# Patient Record
Sex: Male | Born: 1935 | Race: White | Hispanic: No | Marital: Married | Smoking: Never smoker
Health system: Southern US, Community
[De-identification: ages and names within clinical notes are randomized; demographics above are authoritative.]

## PROBLEM LIST (undated history)

## (undated) DIAGNOSIS — G4700 Insomnia, unspecified: Secondary | ICD-10-CM

## (undated) DIAGNOSIS — N529 Male erectile dysfunction, unspecified: Secondary | ICD-10-CM

## (undated) DIAGNOSIS — Z9889 Other specified postprocedural states: Secondary | ICD-10-CM

## (undated) DIAGNOSIS — N400 Enlarged prostate without lower urinary tract symptoms: Secondary | ICD-10-CM

## (undated) DIAGNOSIS — M48061 Spinal stenosis, lumbar region without neurogenic claudication: Secondary | ICD-10-CM

## (undated) DIAGNOSIS — M545 Low back pain, unspecified: Secondary | ICD-10-CM

## (undated) DIAGNOSIS — IMO0002 Reserved for concepts with insufficient information to code with codable children: Secondary | ICD-10-CM

## (undated) DIAGNOSIS — K219 Gastro-esophageal reflux disease without esophagitis: Secondary | ICD-10-CM

## (undated) DIAGNOSIS — K649 Unspecified hemorrhoids: Secondary | ICD-10-CM

## (undated) DIAGNOSIS — E785 Hyperlipidemia, unspecified: Secondary | ICD-10-CM

## (undated) DIAGNOSIS — M961 Postlaminectomy syndrome, not elsewhere classified: Secondary | ICD-10-CM

## (undated) DIAGNOSIS — M109 Gout, unspecified: Secondary | ICD-10-CM

## (undated) DIAGNOSIS — C801 Malignant (primary) neoplasm, unspecified: Secondary | ICD-10-CM

## (undated) DIAGNOSIS — Z974 Presence of external hearing-aid: Secondary | ICD-10-CM

## (undated) DIAGNOSIS — G4730 Sleep apnea, unspecified: Secondary | ICD-10-CM

## (undated) DIAGNOSIS — F419 Anxiety disorder, unspecified: Secondary | ICD-10-CM

## (undated) DIAGNOSIS — M25561 Pain in right knee: Secondary | ICD-10-CM

## (undated) HISTORY — DX: Low back pain: M54.5

## (undated) HISTORY — DX: Gout, unspecified: M10.9

## (undated) HISTORY — DX: Low back pain, unspecified: M54.50

## (undated) HISTORY — DX: Gastro-esophageal reflux disease without esophagitis: K21.9

## (undated) HISTORY — DX: Reserved for concepts with insufficient information to code with codable children: IMO0002

## (undated) HISTORY — DX: Insomnia, unspecified: G47.00

## (undated) HISTORY — PX: LUMBAR SPINE SURGERY: SHX701

## (undated) HISTORY — DX: Other specified postprocedural states: Z98.890

## (undated) HISTORY — PX: CATARACT EXTRACTION W/ INTRAOCULAR LENS  IMPLANT, BILATERAL: SHX1307

## (undated) HISTORY — PX: TOTAL HIP ARTHROPLASTY: SHX124

## (undated) HISTORY — DX: Benign prostatic hyperplasia without lower urinary tract symptoms: N40.0

## (undated) HISTORY — PX: COLONOSCOPY: SHX174

## (undated) HISTORY — DX: Pain in right knee: M25.561

## (undated) HISTORY — DX: Unspecified hemorrhoids: K64.9

## (undated) HISTORY — DX: Spinal stenosis, lumbar region without neurogenic claudication: M48.061

## (undated) HISTORY — PX: MOHS SURGERY: SUR867

## (undated) HISTORY — PX: KNEE SURGERY: SHX244

## (undated) HISTORY — PX: WISDOM TOOTH EXTRACTION: SHX21

## (undated) HISTORY — DX: Male erectile dysfunction, unspecified: N52.9

---

## 2000-06-27 ENCOUNTER — Encounter: Payer: Self-pay | Admitting: Family Medicine

## 2000-06-27 ENCOUNTER — Ambulatory Visit (HOSPITAL_COMMUNITY): Admission: RE | Admit: 2000-06-27 | Discharge: 2000-06-27 | Payer: Self-pay | Admitting: Family Medicine

## 2006-10-11 ENCOUNTER — Inpatient Hospital Stay (HOSPITAL_COMMUNITY): Admission: RE | Admit: 2006-10-11 | Discharge: 2006-10-14 | Payer: Self-pay | Admitting: Neurological Surgery

## 2006-11-11 ENCOUNTER — Encounter: Admission: RE | Admit: 2006-11-11 | Discharge: 2006-11-11 | Payer: Self-pay | Admitting: Neurological Surgery

## 2007-01-27 ENCOUNTER — Encounter: Admission: RE | Admit: 2007-01-27 | Discharge: 2007-01-27 | Payer: Self-pay | Admitting: Neurological Surgery

## 2007-04-28 ENCOUNTER — Encounter: Admission: RE | Admit: 2007-04-28 | Discharge: 2007-04-28 | Payer: Self-pay | Admitting: Neurological Surgery

## 2007-09-24 ENCOUNTER — Inpatient Hospital Stay (HOSPITAL_COMMUNITY): Admission: EM | Admit: 2007-09-24 | Discharge: 2007-09-27 | Payer: Self-pay | Admitting: Emergency Medicine

## 2008-02-02 ENCOUNTER — Ambulatory Visit (HOSPITAL_BASED_OUTPATIENT_CLINIC_OR_DEPARTMENT_OTHER): Admission: RE | Admit: 2008-02-02 | Discharge: 2008-02-02 | Payer: Self-pay | Admitting: Orthopedic Surgery

## 2008-06-25 ENCOUNTER — Encounter: Admission: RE | Admit: 2008-06-25 | Discharge: 2008-06-25 | Payer: Self-pay | Admitting: Neurological Surgery

## 2008-07-14 IMAGING — CR DG HAND COMPLETE 3+V*L*
3 series · 3 of 3 positions shown · non-contrast
Comparison: None

CLINICAL DATA: Hip pain.  Fall.  Pain first metacarpal of the left
hand.

LEFT HAND - COMPLETE 3+ VIEW

[x hand pa left]
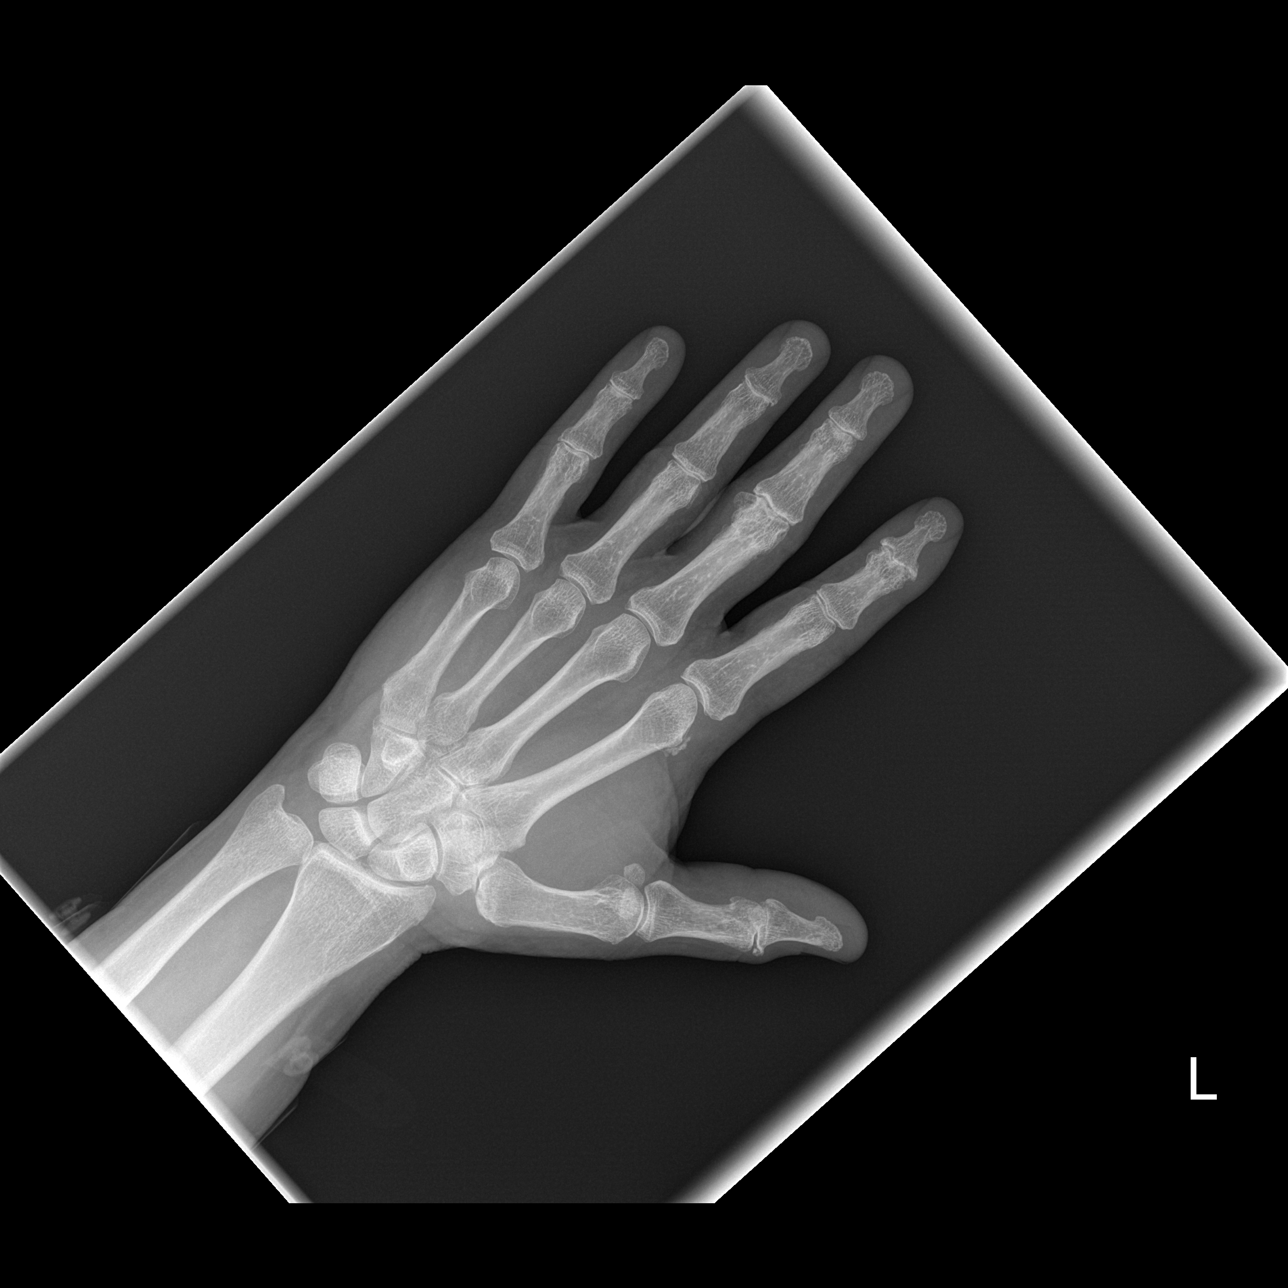

[x hand oblique left]
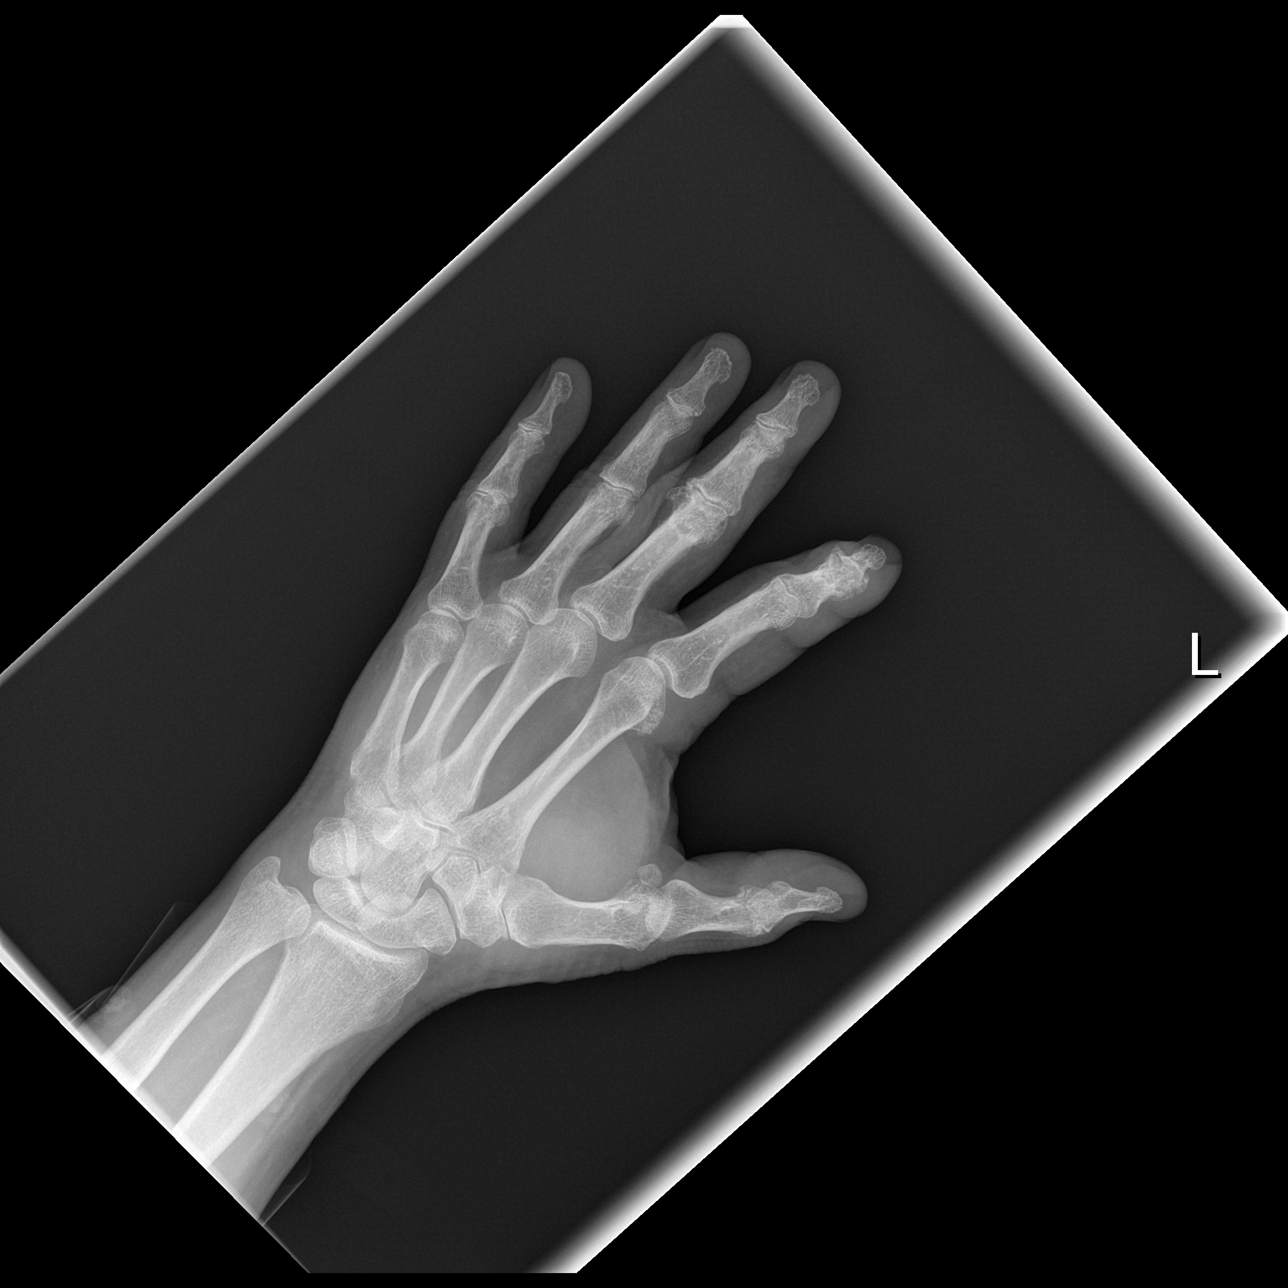

[x hand lat left]
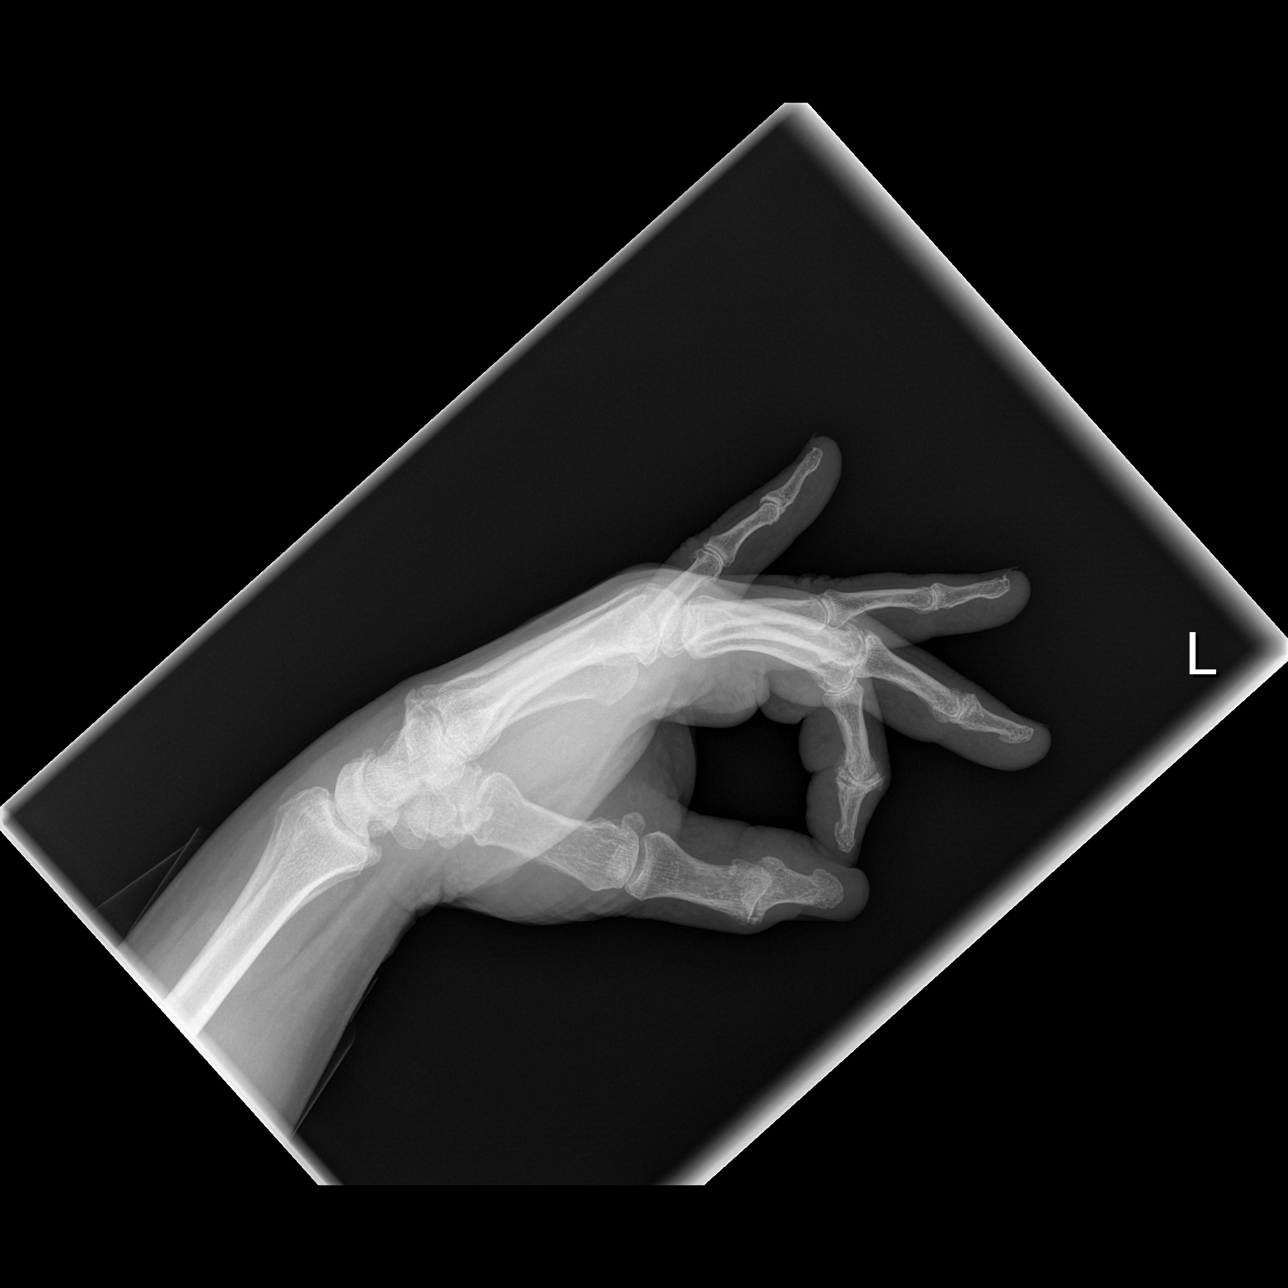

[3 of 3 positions shown; findings below may reference images not displayed]

FINDINGS: Degenerative changes are seen especially the distal
interphalangeal joints.  There is no evidence for acute fracture or
dislocation.  No radiopaque foreign body or soft tissue gas is
identified.
IMPRESSION: No evidence for acute abnormality.

## 2008-07-16 ENCOUNTER — Inpatient Hospital Stay (HOSPITAL_COMMUNITY): Admission: RE | Admit: 2008-07-16 | Discharge: 2008-07-21 | Payer: Self-pay | Admitting: Neurological Surgery

## 2008-08-17 ENCOUNTER — Encounter: Admission: RE | Admit: 2008-08-17 | Discharge: 2008-08-17 | Payer: Self-pay | Admitting: Neurological Surgery

## 2008-08-19 ENCOUNTER — Inpatient Hospital Stay (HOSPITAL_COMMUNITY): Admission: RE | Admit: 2008-08-19 | Discharge: 2008-08-21 | Payer: Self-pay | Admitting: Neurological Surgery

## 2008-09-02 ENCOUNTER — Encounter: Admission: RE | Admit: 2008-09-02 | Discharge: 2008-09-02 | Payer: Self-pay | Admitting: Neurological Surgery

## 2008-09-27 ENCOUNTER — Encounter: Admission: RE | Admit: 2008-09-27 | Discharge: 2008-09-27 | Payer: Self-pay | Admitting: Neurological Surgery

## 2008-11-01 ENCOUNTER — Encounter: Admission: RE | Admit: 2008-11-01 | Discharge: 2008-11-01 | Payer: Self-pay | Admitting: Neurological Surgery

## 2008-12-13 ENCOUNTER — Encounter: Admission: RE | Admit: 2008-12-13 | Discharge: 2008-12-13 | Payer: Self-pay | Admitting: Neurological Surgery

## 2009-01-04 ENCOUNTER — Inpatient Hospital Stay (HOSPITAL_COMMUNITY): Admission: RE | Admit: 2009-01-04 | Discharge: 2009-01-07 | Payer: Self-pay | Admitting: Orthopedic Surgery

## 2009-02-28 ENCOUNTER — Encounter: Admission: RE | Admit: 2009-02-28 | Discharge: 2009-02-28 | Payer: Self-pay | Admitting: Neurological Surgery

## 2009-06-01 ENCOUNTER — Emergency Department (HOSPITAL_COMMUNITY): Admission: EM | Admit: 2009-06-01 | Discharge: 2009-06-02 | Payer: Self-pay | Admitting: Emergency Medicine

## 2009-06-13 ENCOUNTER — Encounter: Admission: RE | Admit: 2009-06-13 | Discharge: 2009-06-13 | Payer: Self-pay | Admitting: Neurological Surgery

## 2009-06-23 IMAGING — CR DG LUMBAR SPINE 2-3V
3 series · 3 of 3 positions shown · non-contrast
Comparison: [DATE]

CLINICAL DATA: Lumbar fusion.  Back pain.

LUMBAR SPINE - 2-3 VIEW

[t l-spine a.p.]
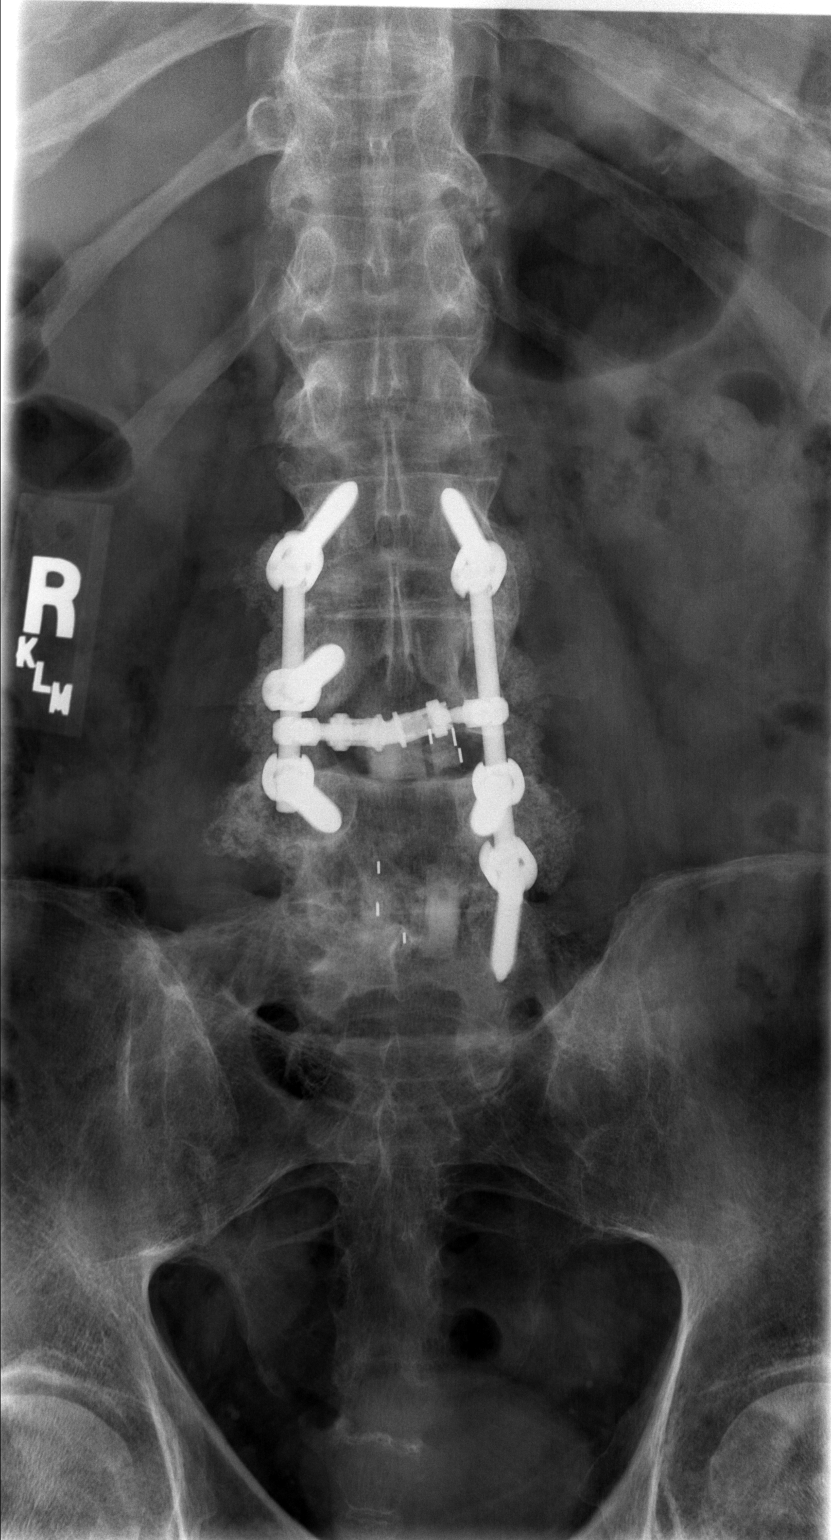

[t l-spine lat]
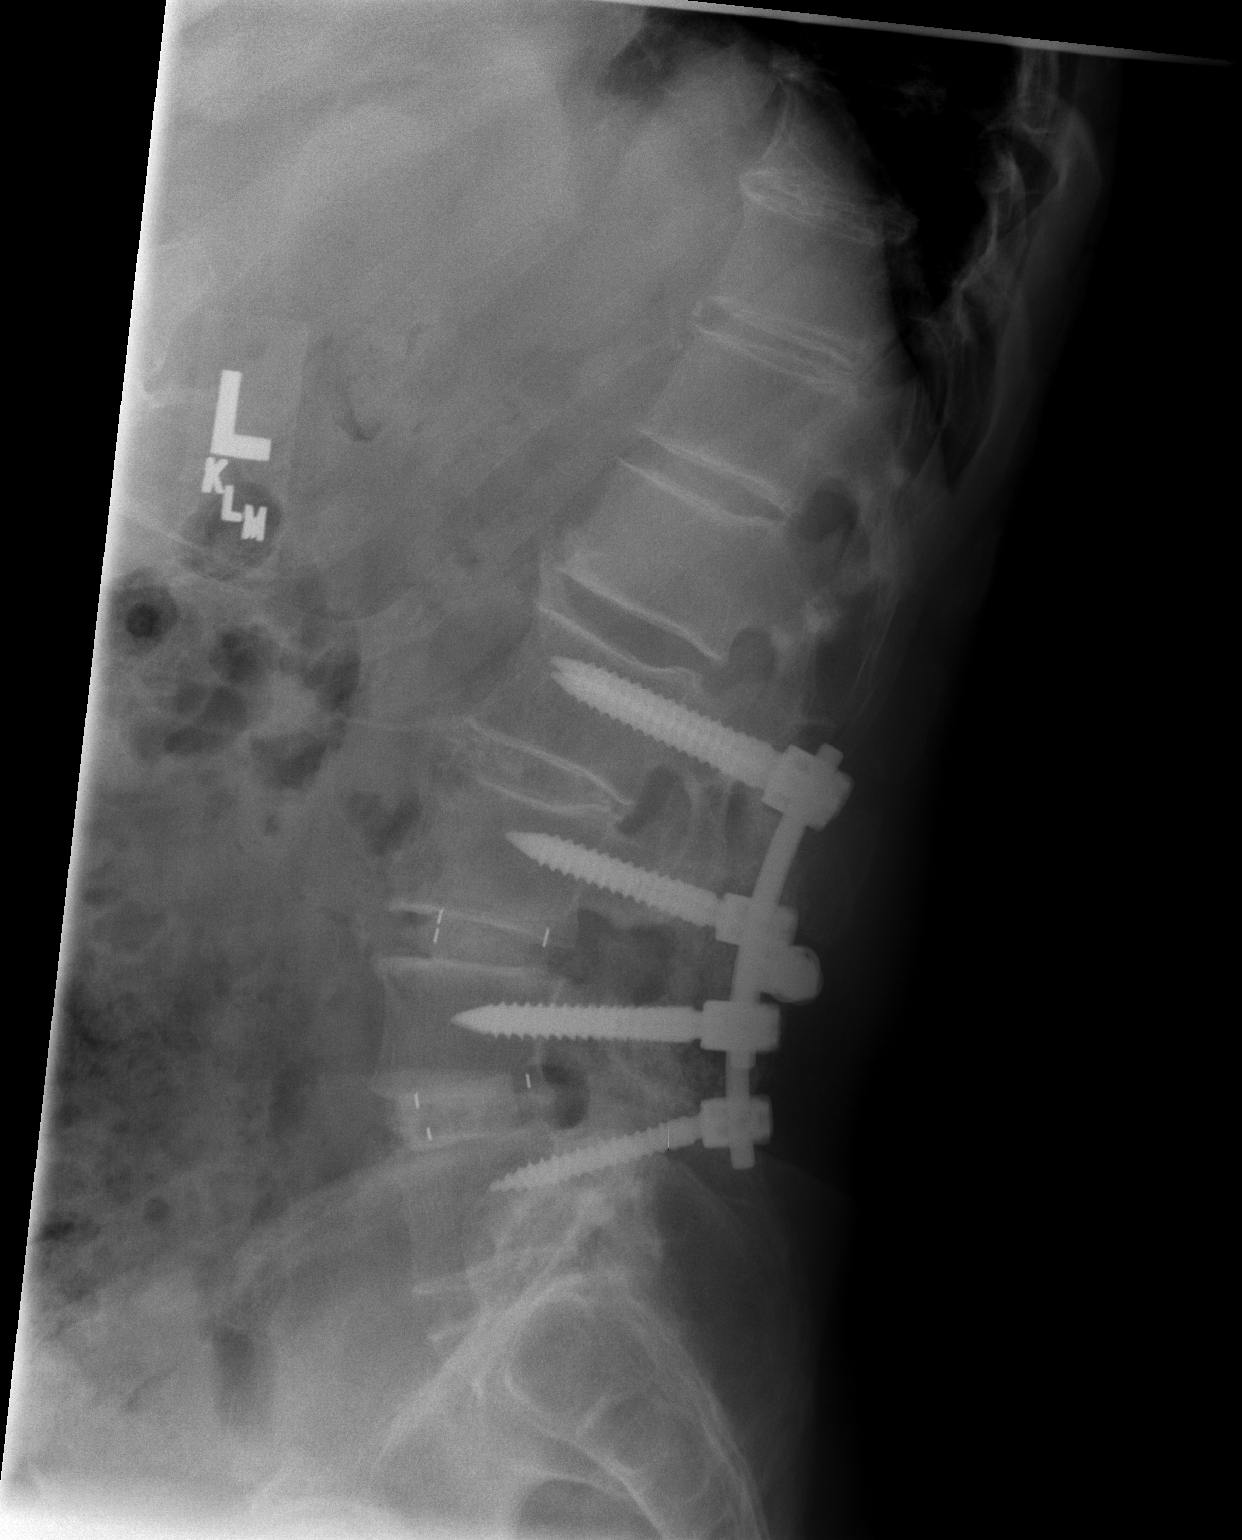

[t l-spine l5-s1 spot]
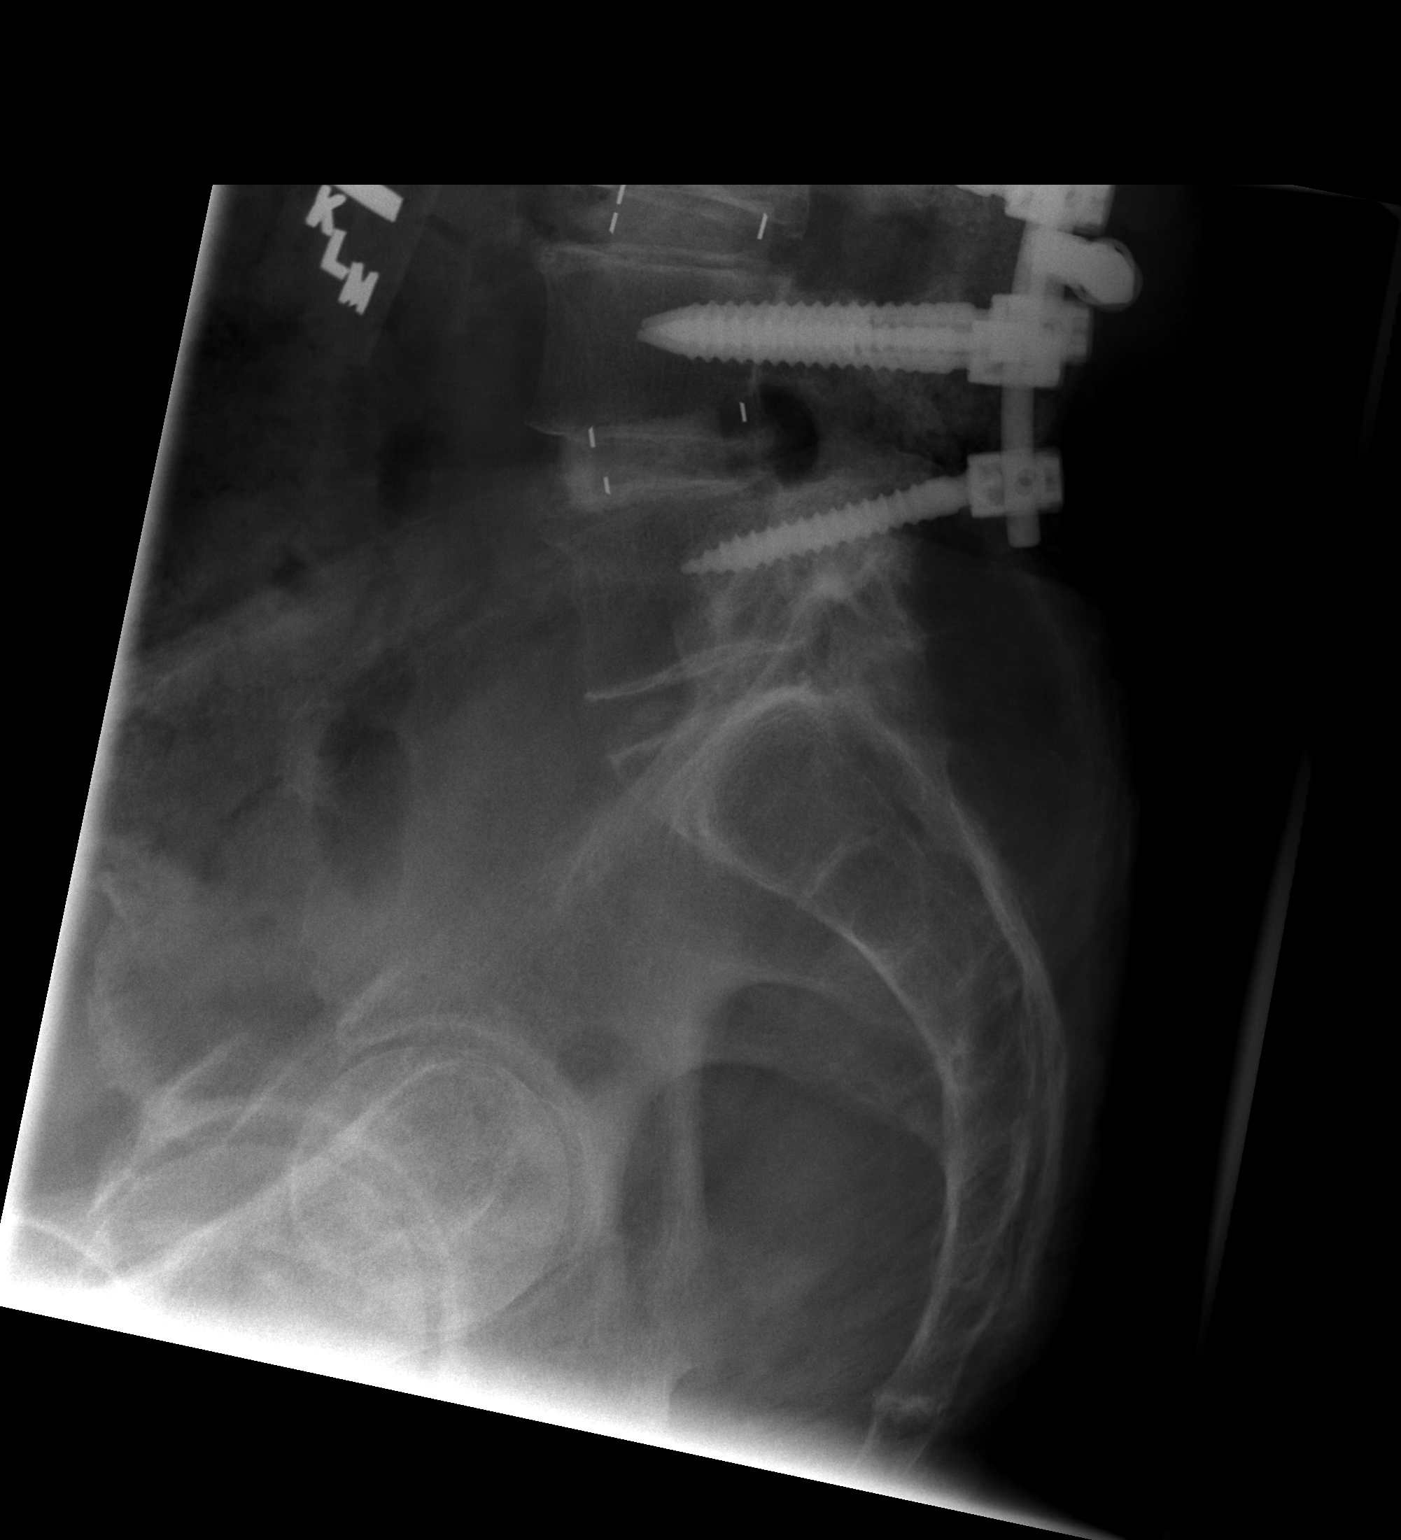

[3 of 3 positions shown; findings below may reference images not displayed]

FINDINGS: There are pedicle screws in place from L2-L5.  These are
bilateral at L2, unilateral on the right at L3, bilaterally at L4 a
unilateral on the left at L5.  Interbody fusion material is in
place at L3-4 and L4-5.  Components appear grossly well positioned.
There is a cross stabilizer.  No radiographically detectable
complication.
IMPRESSION: Decompression and fusion from L2-L5 as described.

## 2010-06-11 LAB — DIFFERENTIAL
Basophils Relative: 0 % (ref 0–1)
Lymphocytes Relative: 32 % (ref 12–46)
Monocytes Absolute: 0.5 10*3/uL (ref 0.1–1.0)
Monocytes Relative: 8 % (ref 3–12)
Neutro Abs: 3.3 10*3/uL (ref 1.7–7.7)
Neutrophils Relative %: 58 % (ref 43–77)

## 2010-06-11 LAB — CBC
Hemoglobin: 14.9 g/dL (ref 13.0–17.0)
MCHC: 34.3 g/dL (ref 30.0–36.0)
RBC: 4.68 MIL/uL (ref 4.22–5.81)
WBC: 5.8 10*3/uL (ref 4.0–10.5)

## 2010-06-11 LAB — HEPATIC FUNCTION PANEL
ALT: 34 U/L (ref 0–53)
AST: 31 U/L (ref 0–37)
Albumin: 4.2 g/dL (ref 3.5–5.2)
Bilirubin, Direct: 0.1 mg/dL (ref 0.0–0.3)
Total Bilirubin: 0.7 mg/dL (ref 0.3–1.2)

## 2010-06-11 LAB — URINALYSIS, ROUTINE W REFLEX MICROSCOPIC
Hgb urine dipstick: NEGATIVE
Nitrite: NEGATIVE
Protein, ur: NEGATIVE mg/dL
Specific Gravity, Urine: 1.006 (ref 1.005–1.030)
Urobilinogen, UA: 0.2 mg/dL (ref 0.0–1.0)

## 2010-06-11 LAB — BASIC METABOLIC PANEL
CO2: 22 mEq/L (ref 19–32)
Calcium: 9.1 mg/dL (ref 8.4–10.5)
Creatinine, Ser: 0.89 mg/dL (ref 0.4–1.5)
GFR calc Af Amer: >60 mL/min (ref >60)
GFR calc non Af Amer: >60 mL/min (ref >60)
Sodium: 139 mEq/L (ref 135–145)

## 2010-06-22 LAB — BASIC METABOLIC PANEL
BUN: 14 mg/dL (ref 6–23)
BUN: 8 mg/dL (ref 6–23)
Calcium: 8 mg/dL — ABNORMAL LOW (ref 8.4–10.5)
Calcium: 8.1 mg/dL — ABNORMAL LOW (ref 8.4–10.5)
Calcium: 9.8 mg/dL (ref 8.4–10.5)
Creatinine, Ser: 0.82 mg/dL (ref 0.4–1.5)
GFR calc Af Amer: >60 mL/min (ref >60)
GFR calc Af Amer: >60 mL/min (ref >60)
GFR calc non Af Amer: >60 mL/min (ref >60)
GFR calc non Af Amer: >60 mL/min (ref >60)
GFR calc non Af Amer: >60 mL/min (ref >60)
Glucose, Bld: 147 mg/dL — ABNORMAL HIGH (ref 70–99)
Glucose, Bld: 123 mg/dL — ABNORMAL HIGH (ref 70–99)
Sodium: 140 mEq/L (ref 135–145)

## 2010-06-22 LAB — DIFFERENTIAL
Basophils Absolute: 0 10*3/uL (ref 0.0–0.1)
Lymphocytes Relative: 29 % (ref 12–46)
Monocytes Absolute: 0.5 10*3/uL (ref 0.1–1.0)
Neutro Abs: 3.4 10*3/uL (ref 1.7–7.7)

## 2010-06-22 LAB — CBC
HCT: 29.3 % — ABNORMAL LOW (ref 39.0–52.0)
HCT: 31.7 % — ABNORMAL LOW (ref 39.0–52.0)
Hemoglobin: 10.3 g/dL — ABNORMAL LOW (ref 13.0–17.0)
Hemoglobin: 10.8 g/dL — ABNORMAL LOW (ref 13.0–17.0)
Hemoglobin: 15.5 g/dL (ref 13.0–17.0)
MCHC: 35 g/dL (ref 30.0–36.0)
MCV: 91.9 fL (ref 78.0–100.0)
MCV: 92 fL (ref 78.0–100.0)
Platelets: 174 10*3/uL (ref 150–400)
Platelets: 248 10*3/uL (ref 150–400)
RDW: 13.8 % (ref 11.5–15.5)
RDW: 13.9 % (ref 11.5–15.5)
RDW: 14.1 % (ref 11.5–15.5)
WBC: 15.3 10*3/uL — ABNORMAL HIGH (ref 4.0–10.5)
WBC: 6.8 10*3/uL (ref 4.0–10.5)
WBC: 5.6 10*3/uL (ref 4.0–10.5)

## 2010-06-22 LAB — TYPE AND SCREEN: ABO/RH(D): O POS

## 2010-06-22 LAB — HEMOGLOBIN AND HEMATOCRIT, BLOOD
HCT: 26.1 % — ABNORMAL LOW (ref 39.0–52.0)
HCT: 36.5 % — ABNORMAL LOW (ref 39.0–52.0)
Hemoglobin: 9.3 g/dL — ABNORMAL LOW (ref 13.0–17.0)

## 2010-06-22 LAB — URINALYSIS, ROUTINE W REFLEX MICROSCOPIC
Bilirubin Urine: NEGATIVE
Hgb urine dipstick: NEGATIVE
Ketones, ur: NEGATIVE mg/dL
Protein, ur: NEGATIVE mg/dL
Urobilinogen, UA: 0.2 mg/dL (ref 0.0–1.0)

## 2010-06-22 LAB — PROTIME-INR: Prothrombin Time: 13 seconds (ref 11.6–15.2)

## 2010-06-22 LAB — APTT: aPTT: 27 seconds (ref 24–37)

## 2010-06-26 LAB — DIFFERENTIAL
Basophils Relative: 1 % (ref 0–1)
Eosinophils Absolute: 0.2 10*3/uL (ref 0.0–0.7)
Eosinophils Relative: 3 % (ref 0–5)
Monocytes Relative: 8 % (ref 3–12)
Neutrophils Relative %: 65 % (ref 43–77)

## 2010-06-26 LAB — BASIC METABOLIC PANEL
BUN: 13 mg/dL (ref 6–23)
CO2: 23 mEq/L (ref 19–32)
Chloride: 107 mEq/L (ref 96–112)
Creatinine, Ser: 0.84 mg/dL (ref 0.4–1.5)

## 2010-06-26 LAB — CBC
MCHC: 34.3 g/dL (ref 30.0–36.0)
MCV: 91.5 fL (ref 78.0–100.0)
Platelets: 250 10*3/uL (ref 150–400)
RBC: 5.01 MIL/uL (ref 4.22–5.81)

## 2010-06-26 LAB — PROTIME-INR: INR: 1.1 (ref 0.00–1.49)

## 2010-06-28 LAB — PROTIME-INR: INR: 1 (ref 0.00–1.49)

## 2010-06-28 LAB — BASIC METABOLIC PANEL
BUN: 18 mg/dL (ref 6–23)
CO2: 28 mEq/L (ref 19–32)
GFR calc non Af Amer: >60 mL/min (ref >60)
Glucose, Bld: 108 mg/dL — ABNORMAL HIGH (ref 70–99)
Potassium: 4.8 mEq/L (ref 3.5–5.1)

## 2010-06-28 LAB — CBC
Hemoglobin: 15.4 g/dL (ref 13.0–17.0)
Platelets: 218 10*3/uL (ref 150–400)
RDW: 13.4 % (ref 11.5–15.5)
WBC: 5.5 10*3/uL (ref 4.0–10.5)

## 2010-06-28 LAB — TYPE AND SCREEN: ABO/RH(D): O POS

## 2010-06-28 LAB — APTT: aPTT: 28 seconds (ref 24–37)

## 2010-08-01 NOTE — H&P (Signed)
Jose Farrell, Jose Farrell               ACCOUNT NO.:  [id number]   MEDICAL RECORD NO.:  [id number]          PATIENT TYPE:  INP   LOCATION:  5120                         FACILITY:  [facility]   PHYSICIAN:  Marlowe Kays, M.D.  DATE OF BIRTH:  [birth year]   DATE OF ADMISSION:  09/24/2007  DATE OF DISCHARGE:                              HISTORY & PHYSICAL   ADDENDUM    It was noted that this patient is allergic to SULFA and SULFA COMPOUNDS.  In the original history and physical, it was stated that he had no  allergies; however, he is allergic to SULFA and SULFA COMPOUNDS.      [name].    ______________________________  Marlowe Kays, M.D.    DLU/MEDQ  D:  09/25/2007  T:  09/25/2007  Job:  [id number]   cc:   Dr. Brunilda Payor

## 2010-08-01 NOTE — Discharge Summary (Signed)
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   PROCEDURE:  Lumbar re-exploration with posterior lumbar interbody fusion  at L3-4 with removal of hardware L4-5.   BRIEF HISTORY OF PRESENT ILLNESS:  Jose Farrell is a very pleasant  gentleman who underwent a L4-5 posterior lumbar interbody fusion a  couple of years ago.  He then had a hip fracture and had a hip  replacement.  He had continued pain in the left leg.  We did a CT  myelogram which showed a solid fusion at L4-5.  He had significant  adjacent level stenosis at L3-4, felt that this was responsible for his  left leg pain and weakness.  Recommended lumbar re-exploration with  decompression, instrumented fusion at L3-4 with removal of hardware at  L4-5.  He understood the risks, benefits, and expected outcome and  wished to proceed.   HOSPITAL COURSE:  The patient was admitted on July 16, 2008, and taken  to the operating room where he underwent a decompressive laminectomy,  posterior lumbar interbody fusion and instrumentation at L3-4 with  removal of hardware at L4-5.  Unfortunately, he suffered a dural tear at  the time of surgery.  He did tolerate the procedure well and was taken  to the recovery room, and then to the floor in stable condition.  For  details of the operative procedure, please see the dictated operative  note.  The patient remained a flat bedrest for 4 days postoperatively in  order to help prevent pseudomeningeal formation.  He was placed on  Lovenox for DVT prophylaxis.  His wound remained clean, dry, and intact.  He  was able to move his lower extremities, he had no low back pain or  leg pain immediately postoperatively.  He did have some pain once he was  mobilized.  He was mobilized on Jul 19, 2008.  His incision remained  clean, dry, and intact.  He worked with physical, occupational therapy.  His Foley was discontinued.  He was able to urinate on his own.  He had  good strength in his lower extremities.  He was discharged to home in  stable condition on Jul 21, 2008 with plans for followup with me in 1  week.   FINAL DIAGNOSIS:  Posterior lumbar interbody fusion at L3-4.      Tia Alert, MD  Electronically Signed     DSJ/MEDQ  D:  08/06/2008  T:  08/07/2008  Job:  [id number]

## 2010-08-01 NOTE — Discharge Summary (Signed)
NAMECAILAN, Jose NO.:  [id number]   MEDICAL RECORD NO.:  [id number]          PATIENT TYPE:  INP   LOCATION:  5149                         FACILITY:  [facility]   PHYSICIAN:  Tia Alert, MD     DATE OF BIRTH:  11/13/1935   DATE OF ADMISSION:  10/11/2006  DATE OF DISCHARGE:  10/14/2006                               DISCHARGE SUMMARY   ADMITTING DIAGNOSES:  1. Stenosis at L3-4.  2. Spondylolisthesis, L4-5.   PROCEDURE:  Decompressive laminectomy, L3-4, with posterior lumbar  interbody fusion with non segmental instrumentation, L4-5.   BRIEF HISTORY OF PRESENTATION:  [name] is a very pleasant, 75-year-  old gentleman who was referred with back pain with bilateral leg pain.  He had evidence of neurogenic claudication by history.  He had an MRI  which showed severe spinal stenosis at L4-5 with a bilateral synovial  cyst, severe facet arthropathy and broad-based disk bulge with a minimal  grade 1 spondylolisthesis.  He also had adjacent level moderate stenosis  at L3-4, mostly in the lateral recesses from ligamentum flavum  overgrowth.  He had tried medical management for quite some time without  significant relief.  His pain was progressively worsening.  I  recommended a posterior lumbar interbody fusion at L4-5 to address the  severe stenosis and the segmental instability with undercutting the L3  lamina, decompressed at the L3-4 level at the time of the surgery.  He  understood the risks, benefits and expected outcome and wished to  proceed.   HOSPITAL COURSE:  The patient was admitted on October 11, 2006 and taken to  the operating room where he underwent a PLIF at L4-5 with a  decompressive laminectomy at L3-4.  The patient tolerated the procedure  well and was taken to the recovery room and then to the floor in stable  condition.  For details of the operative procedure, please see the  dictated operative report.  The patient's hospital course was  routine.  There were no complications.  He spent the first night at bed rest with  a Foley catheter in place and a Dilaudid PCA for pain control.  The next  day he was doing quite well.  He was allowed out of bed, was ambulating  without difficulty.  He had appropriate back soreness, but no leg pain.  His Dilaudid PCA was discontinued, and he was mobilized quite slowly.  His Foley catheter was discontinued.  He had no trouble with urination.  He continued this course over the next couple of days.  He was afebrile  with stable vital signs.  His pain was well controlled.  His Dilaudid  PCA was discontinued and he became well controlled on oral pain  medication.  He was discharged to home in stable condition on October 14, 2006 with plans for followup in two weeks.   DISCHARGE DIAGNOSIS:  Posterior lumbar interbody fusion, L4-5.      Tia Alert, MD  Electronically Signed     DSJ/MEDQ  D:  11/08/2006  T:  11/09/2006  Job:  [id number]

## 2010-08-01 NOTE — Op Note (Signed)
Jose Farrell, Jose Farrell               ACCOUNT NO.:  [id number]   MEDICAL RECORD NO.:  [id number]          PATIENT TYPE:  INP   LOCATION:  3014                         FACILITY:  [facility]   PHYSICIAN:  Tia Alert, MD     DATE OF BIRTH:  08-15-1935   DATE OF PROCEDURE:  07/16/2008  DATE OF DISCHARGE:                               OPERATIVE REPORT   PREOPERATIVE DIAGNOSES:  Adjacent level spinal stenosis, L3-L4 adjacent  to a previous lumbar fusion, L4-L5 with back and leg pain.   POSTOPERATIVE DIAGNOSES:  Adjacent level spinal stenosis, L3-L4 adjacent  to a previous lumbar fusion, L4-L5 with back and leg pain.   PROCEDURES:  1. Lumbar reexploration with exploration of fusion L4-L5 and removal      of hardware, L4-L5.  2. Decompressive lumbar laminectomy, hemifacetectomy, and      foraminotomy, L3-L4 for decompression at L3 and L4 nerve roots      requiring more work than is required of the typical posterior      lumbar interbody fusion procedure to decompress the L3 and the L4      nerve roots.  3. Posterior lumbar interbody fusion, L3-L4 utilizing 10- x 26-mm      tangent interbody bone wedge and a 10- x 26-mm PEEK interbody cage      packed with local autograft and Actifuse putty.  The midline was      packed with local autograft and Actifuse putty.  4. Intertransverse arthrodesis, L3-L4 bilaterally utilizing locally      harvested morselized autologous bone graft and Actifuse putty.  5. Nonsegmental fixation, L3-L4 (a separate level from the hardware      removal level) utilizing the Radius pedicle screw system.   SURGEON:  Tia Alert, MD   ASSISTANT:  1. Henry A. Pool, MD  2. Reinaldo Meeker, MD   ANESTHESIA:  General endotracheal.   COMPLICATIONS:  Dural tear repaired primarily.   BRIEF HISTORY OF PRESENT ILLNESS:  Mr. Dills is a 75 year old  gentleman who underwent a posterior lumbar interbody fusion, L4-L5 about  2 years ago.  He then underwent hip replacement  about 6-8 months ago and  had difficulty with leg pain following that.  He was ruled out for  complications of his hip replacement and was sent to see me regarding  potential leg pain, from spinal stenosis.  We did a CT myelogram, which  confirmed a solid fusion at L4-L5, but also confirmed spinal stenosis at  L3-L4 with underfilling of the L3 and the L4 nerve roots.  I recommended  a lumbar reexploration with removal of the hardware at L4-L5 followed by  posterior lumbar interbody fusion with nonsegmental instrumentation, L3-  L4.  He understood the risks, benefits, and expected outcome, and wished  to proceed.   FINDINGS OF PROCEDURE:  He had very thick scar tissue extending up to  above the disk space level of L3-L4.  Removing the inferior facets of L3  was quite difficult as it was very stuck to the dura.  We were able to  disconnect this at the pars,  lift the bone of the inferior facet and try  to remove it with dental dissectors, gently dissecting the dura away  from the scarred edges, but dural tears were created on both sides while  trying to do this.  These were repaired primarily during the surgery  with 5-0 Prolene suture.   DESCRIPTION OF THE PROCEDURE:  The patient was taken to the operating  room.  After induction of adequate generalized endotracheal anesthesia,  he was rolled in a prone position on chest rolls and all pressure points  were padded.  His lumbar region was prepped with DuraPrep and then  draped in the usual sterile fashion.  Local anesthesia 10 mL was  injected and his old incision was opened up and the dissection was  carried down through the fascia.  The paraspinous musculature was taken  down in a subperiosteal fashion at L3-L4 and out over the facets to  expose the pedicle screw at L4 and I was then able to dissect over the  pedicle screws to identify both pedicle screws at L4 and L5 and the  rods.  I took the dissection out and identified the transverse  processes  of L3.  I then removed the scar tissue from the surface of the hardware.  I was able to remove the locking caps, I then removed the rods, and the  L5 pedicle screws were then removed.  The fusion at L4-L5 was explored.  It seemed to have a solid intertransverse arthrodesis.  I then removed  this spinous process of L3 with the Leksell rongeur and then performed a  complete laminectomy, hemifacetectomy, and foraminotomy at L3-L4  utilizing the Kerrison punches and Leksell rongeur.  The inferior facets  of L3 were completely scarred to the thinned dura and while teasing  these away, we created dural tears on both sides while pulling traction  on the facet to try to tease the bone away from the dura utilizing  dental dissectors.  Once these were removed, we had large linear dural  tears along with the bone that had been attached to the dura.  We then  sewed these close with running 5-0 Prolene sutures on both sides.  The  L3 and the L4 nerve roots were decompressed by marching along them into  their respective foramina, decompressing as we went.  He had significant  compression of the L3 nerve roots bilaterally from overgrown ligament.  This was removed as the hemifacetectomies were completed.  Once the  course was complete and the dural repair was complete, we detethered the  dura from the surface of the nerve of the disk space.  We incised the  disk space bilaterally and sequentially distracted the disk space at 10  mm.  We then used the 10-mm rotating cutter and 10-mm cutting chisel  along with Epstein curettes and pituitary rongeurs to perform a near-  complete diskectomy and prepare the endplates for arthrodesis.  A 10- x  26-mm PEEK interbody cage packed with local autograft and Actifuse putty  was tapped in into the interspace on the patient's left side and a 10- x  26-mm tangent interbody bone wedge was used on the patient's right side.  The midline was packed with local  autograft and Actifuse putty.  We then  localized the pedicle screw entry zones at L3, probed each pedicle with  a pedicle probe, tapped each pedicle with a 5.5 tap, palpated each  pedicle with ball probe to assure no break in the  cortex and then placed  6.75- x 45-mm pedicle screws in the L3 pedicles bilaterally.  We then  decorticated the transverse processes and placed a mixture of local  autograft and Actifuse putty out over these to perform intertransverse  arthrodesis, L3-L4.  We then put lordotic rods into multiaxial screw  heads of the pedicle screws and locked these in position with locking  caps and anti-torque device while achieving compression of our grafts.  We then irrigated with saline solution containing bacitracin, inspected  our dural repair.  Once again, I thought there was couple of small areas  that needed extra suture.  Therefore, I put a couple of interrupted 5-0  Prolene sutures.  I then used Tisseel fibrin glue and lined the dura  with Gelfoam and after irrigated with copious amounts of bacitracin  containing saline solution.  I then closed the muscle and the fascia  with 0 Vicryl.  I placed a subfascial, but supramuscular Hemovac drain  and then closed the fascia with 0 Vicryl, closed the subcutaneous tissue  with 2-0 Vicryl, and the subcuticular tissue with 3-0 Vicryl.  The skin  was closed with benzoin and Steri-Strips.  The drapes were removed.  A  sterile dressing was applied.  The patient was awakened from general  anesthesia and transferred to recovery room in stable condition.  At the  end of the procedure, all sponge, needle, and instrument counts were  correct.     Tia Alert, MD  Electronically Signed    DSJ/MEDQ  D:  07/16/2008  T:  07/17/2008  Job:  [id number]

## 2010-08-01 NOTE — Op Note (Signed)
Jose Farrell, [name]               ACCOUNT NO.:  [id number]   MEDICAL RECORD NO.:  [id number]          PATIENT TYPE:  INP   LOCATION:  5120                         FACILITY:  [facility]   PHYSICIAN:  Marlowe Kays, M.D.  DATE OF BIRTH:  1935/10/29   DATE OF PROCEDURE:  09/24/2007  DATE OF DISCHARGE:                               OPERATIVE REPORT   PREOPERATIVE DIAGNOSIS:  Closed slightly displaced left femoral neck  fracture.   POSTOPERATIVE DIAGNOSIS:  Closed slightly displaced left femoral neck  fracture.   OPERATION:  Closed reduction and internal fixation of femoral neck  fracture with three Ace cannulated screws.   SURGEON:  Marlowe Kays, MD   ASSISTANT:  Druscilla Brownie. Idolina Primer, PA-C.   ANESTHESIA:  General.   JUSTIFICATION FOR PROCEDURE:  The femoral neck fracture was incomplete  and was slightly displaced and I felt it was a minimal to close  reduction and pinning.  The family was advised that occasionally we will  see avascular necrosis of the femoral head in which case a prosthesis  might be necessary at a later date.   PROCEDURE:  Prophylactic antibiotics, satisfactory general anesthesia,  he was placed on a Jackson fracture table and with traction rotation  essentially an anatomic reduction was obtained using the C-arm for  verification.  I then prepped the lateral hip area with DuraPrep and  draped them in a sterile field using the shower curtain.  With the C-  arm, I marked out the area proposed initial incision.  Time-out  performed.  We made an incision down through the fascia lata. The vastus  lateralis was opened posteriorly and the lateral femoral cortex exposed.  We then placed a guide pin on the femur for the approximate level of my  initial pin placement.  I then used the Ace cannulated guide, set at 135  degrees to make my initial pin placement which was just right above the  calcar on the AP projection and in good position on the lateral going  down the  middle of the femoral neck and the femoral head.  I then  followed this by placing two pins superior to this again using the  cannulated apparatus and checking with AP and lateral C-arm.  The three  guide pins were then measured and found respectively from top to bottom  to be 90 to 95 and 100 mm in length.  I then used a 6.5 mm cannulated  screws overdrilling them and following with C-arm until they were  flushed to the femoral cortex and well positioned in the femoral head.  Documentary pictures of the guide pins were removed.  Documentary AP and  lateral pictures were taken.  The wound was irrigated well with sterile  saline and closed with running #1 Vicryl in the vastus lateralis,  interrupted #1 Vicryl in the fascia, #1 and 2-0 Vicryl subcutaneous  tissues, staples in the skin.  Dry sterile dressing was applied.  He  tolerated the procedure well.  He was on his way to the recovery room in  satisfactory condition at the time of this dictation.  Estimated blood  loss was perhaps 100 mL.  No blood replacement.          ______________________________  Marlowe Kays, M.D.    JA/MEDQ  D:  09/24/2007  T:  09/25/2007  Job:  [id number]

## 2010-08-01 NOTE — Op Note (Signed)
NAMECOLM, Jose NO.:  [id number]   MEDICAL RECORD NO.:  [id number]          PATIENT TYPE:  INP   LOCATION:  2899                         FACILITY:  [facility]   PHYSICIAN:  Tia Alert, MD     DATE OF BIRTH:  December 17, 1935   DATE OF PROCEDURE:  10/11/2006  DATE OF DISCHARGE:                               OPERATIVE REPORT   PREOPERATIVE DIAGNOSIS:  1. Lumbar spinal stenosis L3-L4.  2. Lumbar spinal stenosis L4-L5 with severe facet arthropathy, grade 1      spondylolisthesis, and bilateral synovial cyst.  3. Back and bilateral leg pain.   POSTOPERATIVE DIAGNOSIS:  1. Lumbar spinal stenosis L3-L4.  2. Lumbar spinal stenosis L4-L5 with severe facet arthropathy, grade 1      spondylolisthesis, and bilateral synovial cyst.  3. Back and bilateral leg pain.   PROCEDURE:  1. Decompressive lumbar laminectomy and lateral recess decompression      L3-L4.  2. Decompressive lumbar laminectomy, hemifacetectomy, and foraminotomy      at L4-L5, decompression of the L4 and L5 nerve roots requiring more      work than is required of the typical posterior lumbar interbody      fusion.  3. Post lumbar interbody fusion L4-L5 utilizing 12 x 26 mm PEEK      interbody cage packed with locally harvested morselized autologous      bone graft and Actifuse putty and a 10 x 26 mm Tangent interbody      bone wedge.  4. Intertransverse arthrodesis L4-L5 utilizing locally harvested      morselized autologous bone graft mixed with Actifuse putty.  5. Nonsegmental fixation L4-L5 utilizing the Radius pedicle screw      system.   SURGEON:  Tia Alert, M.D.   ASSISTANT:  Kathaleen Maser. Pool, M.D.   ANESTHESIA:  General endotracheal anesthesia.   COMPLICATIONS:  None apparent.   INDICATIONS FOR PROCEDURE:  Mr. Troiano is a very pleasant 75 year old  gentleman who is referred with back pain with bilateral leg pain with  evidence of neurogenic claudication.  He had an MRI which showed  severe  spinal stenosis at L4-L5 with bilateral synovial cysts, severe facet  arthropathy, and broad based disc bulge with a minimal grade 1  spondylolisthesis.  He had adjacent level moderate stenosis at L3-L4,  mostly in the lateral recesses from ligamentum flavum overgrowth.  He  had tried medical management for quite some time without significant  relief.  His pain was progressively worsening.  I recommended a  posterior lumbar interbody fusion at L4-L5 to address the severe  stenosis, the evidence of segmental instability, and the facet cysts,  followed by undercutting of the L3 lamina and lateral recess to  decompress the L4 nerve roots up to the disc space level.  He understood  the risks, benefits, and expected outcome of this procedure and wished  to proceed.   DESCRIPTION OF PROCEDURE:  The patient was taken to operating room.  After induction of adequate generalized endotracheal anesthesia, he was  rolled into the prone position on chest rolls and all  pressure points  were padded.  His lumbar region was prepped with DuraPrep and draped in  the usual sterile fashion.  10 mL local anesthesia was injected and a  dorsal midline incision was made and carried down to the lumbosacral  fascia.  The fascia was opened and the paraspinous musculature was taken  down in a subperiosteal fashion to expose L3-L4 and L4-L5.  Intraoperative fluoroscopy confirmed my level then I took dissection out  over the facets to expose the transverse processes of L4 and L5.   I then used a combination of Leksell rongeur, Kerrison punches, and the  high speed drill to perform a complete laminectomy, hemifacetectomy and  foraminotomy at L4-L5. The L4 and L5 nerve roots were identified by  removing the yellow ligament and followed out into their foramina  distally.  He had two synovial cysts, one on the right and one on the  left.  The one on the right was much larger than the one on the left.  This was  somewhat stuck to the dura.  I spent time peeling this away  from the dural surface and then removing it in a piecemeal fashion.  The  L5 nerve roots bilaterally nicely relaxed with this decompression. Once  my decompression was completed at L4-L5,  I drilled away the inferior  part of the lamina, the underside of the medial facet, and the underside  of the spinous process in order to undercut the L3 lamina up to the disc  space level of L3-L4 and followed the L4 nerve root down marching along  the medial part of the pedicle.   Once this decompression was complete, I turned my attention to pedicle  screw fixation.  I localized the pedicle screw entry points by using  surface landmarks and lateral fluoroscopy.  I probed each pedicle with a  pedicle probe, tapped each pedicle at L4 and L5 with a 5.25 tap, and  then placed 6.75 x 50 mm pedicle screws into the L4 pedicles bilaterally  and 6.75 x 45 mm pedicle screws into the L5 pedicles bilaterally.   I then turned my attention to the posterior lumbar interbody fusion.  I  incised the disc space bilaterally while protecting the nerve root with  a Durico retractor.  We did the initial discectomy with pituitary  rongeurs and curets.  We then distracted the disc space up to 12 mm and  then used a combination of rotating cutter, the 12 mm cutting chisel,  and pituitary rongeurs to prepare the endplates for arthrodesis.  We did  this bilaterally.  We prepared the midline with Epstein curets.  Once  the complete discectomy was performed using these instruments, a 12 x 26  mm Tangent interbody bone wedge was placed at L4-L5 on the left.  The  midline was packed with local autograft and Actifuse and on the right, a  12 x 26 mm PEEK interbody cage was packed with local autograft and  Actifuse and tapped into position.  We then checked this under  fluoroscopy.  We then decorticated transverse processes and placed a  mixture of local autograft and  Actifuse out over these to perform  intertransverse arthrodesis at L4-L5 bilaterally.  We then placed two 40  mm lordotic rods into the multiaxial screw heads of the pedicle screws  and locked these into position with a locking cap and anti-torque device  after achieving compression of our grafts.   I then irrigated with saline solution containing bacitracin,  lined the  dura with Gelfoam after inspecting the nerve roots, once again, and  palpating with coronary dilator to assure adequate decompression of the  nerve roots.  I, again, lined the dura with Gelfoam, placed a medium  Hemovac drain through a separate stab incision, and then closed the  muscle and the fascia with 0 Vicryl, closed the subcutaneous and  subcuticular tissue with 2-0 and 3-0 Vicryl, and  closed the skin with Benzoin and Steri-Strips.  The drapes were removed.  A sterile dressing was applied.  The patient was awakened from general  anesthesia and transferred to the recovery room in stable condition.  At  the end of the procedure, all sponge, needle and instrument counts were  correct.      Tia Alert, MD  Electronically Signed     DSJ/MEDQ  D:  10/11/2006  T:  10/12/2006  Job:  [id number]

## 2010-08-01 NOTE — Op Note (Signed)
NAMELAQUAN, Jose Farrell               ACCOUNT NO.:  [id number]   MEDICAL RECORD NO.:  [id number]          PATIENT TYPE:  AMB   LOCATION:  DSC                          FACILITY:  [facility]   PHYSICIAN:  Marlowe Kays, M.D.  DATE OF BIRTH:  [birth year]   DATE OF PROCEDURE:  02/02/2008  DATE OF DISCHARGE:                               OPERATIVE REPORT   PREOPERATIVE DIAGNOSIS:  Painful ACE cannulated screws, left hip.   POSTOPERATIVE DIAGNOSIS:  Painful ACE cannulated screws, left hip.   OPERATION:  Removal of 3 ACE cannulated screws, left hip.   SURGEON:  Marlowe Kays, MD   ASSISTANT:  Nurse.   ANESTHESIA:  General.   JUSTIFICATION FOR PROCEDURE:  Little over 4 months ago, he had pinning  of a left femoral neck fracture with the 3 screws.  The fracture has  gone on the heel and there is no evidence for AVN of the femoral head.  He is having progressive pain over the lateral hip and last week in the  office, I injected the area of tenderness over the 3 pins with Marcaine  and this completely relieved his pain indicating that I felt that the  pins, which I have backed out slightly with consolidation of fracture as  being the cause for his symptoms.  There was no evidence for any other  cause.   PROCEDURE:  After satisfactory general anesthesia, little bump was  placed on the left hip, left hip was prepped with DuraPrep and draped in  Sterile field, Ioban employed.  Time-out performed.  I infiltrated the  previous incision with 0.5% Marcaine with adrenaline and then made  incision down over the midportion of it down through the fascia lata and  vastus lateralis to feel the pins.  Three of them were individually  demarcated and removed with the ACE remover.  I then irrigated the wound  well and infiltrated the soft tissues once again with 0.5% Marcaine with  adrenaline.  I then closed the wound with running #1 Vicryl and the  vastus lateralis, interrupted #1 Vicryl and the fascia, and  deep  subcutaneous tissue with 2-0 Vicryl, superficial subcutaneous tissue  with staples on the skin.  Betadine Adaptic dry sterile dressings were  applied.  He tolerated the procedure well at the time of this dictation,  he was all the way recovering in satisfactory condition with no known  complications and minimal blood loss.           ______________________________  Marlowe Kays, M.D.     JA/MEDQ  D:  02/02/2008  T:  02/03/2008  Job:  [id number]

## 2010-08-01 NOTE — Op Note (Signed)
Jose Farrell, Jose Farrell NO.:  [id number]   MEDICAL RECORD NO.:  [id number]          PATIENT TYPE:  INP   LOCATION:  3031                         FACILITY:  [facility]   PHYSICIAN:  Tia Alert, MD     DATE OF BIRTH:  Apr 24, 1935   DATE OF PROCEDURE:  08/19/2008  DATE OF DISCHARGE:                               OPERATIVE REPORT   PREOPERATIVE DIAGNOSIS:  Hardware failure, L3-L4, status post posterior  lumbar interbody fusion.   POSTOPERATIVE DIAGNOSIS:  Hardware failure, L3-L4, status post posterior  lumbar interbody fusion.   PROCEDURES:  1. Lumbar re-exploration with exploration of fusion and hardware, L3-      L4.  2. Removal of existing hardware, L3-L4.  3. Reinsertion of hardware, L3, L4, and L5.  4. Placement of 2 new pedicle screws at L2 bilaterally.  5. Revision of posterior lumbar interbody fusion, L3-L4 with      replacement of tangent interbody bone wedge.  6. Intertransverse arthrodesis, L2-L3 utilizing Actifuse putty.   SURGEON:  Tia Alert, MD   ASSISTANT:  Kathaleen Maser. Pool, MD   ANESTHESIA:  General endotracheal.   COMPLICATIONS:  None apparent.   INDICATIONS FOR THE PROCEDURE:  Jose Farrell is a 75 year old gentleman  who underwent a posterior lumbar interbody fusion at L3-L4 about 5 weeks  ago.  He ended up undergoing a previous posterior lumbar interbody  fusion at L4-L5 and had done extremely well and achieved solid fusion at  that level.  At the time of the last fusion, we removed the L5 pedicle  screws, left the L4 pedicle screws in place, placed 2 new pedicle screws  at L3.  At his 44-month follow up on his plain films, he was found to  have a dislodged rod from the L4 pedicle screw on the right with obvious  loosening of the L3 pedicle screw on the left and the tangent interbody  bone wedge had extruded into the epidural space.  I recommended lumbar  re-exploration with revision of the hardware.  He understood the risks,  benefits,  expected outcome, and wished to proceed.   FINDINGS AT SURGERY:  Upon inspection of the pedicle screws and removal  of the loosened rod at L3-L4 on the right, we found both L3 pedicle  screws to be loose.  The L3 pedicle screw on the left had windshield  wipered and there was no way to replace the pedicle screw in that  pedicle; however, we were able to place a larger pedicle screw into the  L3 pedicle on the right.  The L4 pedicle screws appeared to be tight;  however, upon replacing the L4 pedicle screw on the left, we found the  pedicle to be somewhat split, therefore we had to carry our  instrumentation back down to L5 where we placed another pedicle screw at  L5.  New pedicle screws were placed at L2.  The interbody graft was  found to be extruded.  This was replaced in the L3-L4 interspace on the  right.  We did not find evidence of CSF leak.   DESCRIPTION  OF THE PROCEDURE:  The patient was taken to the operating  room and after induction of adequate generalized endotracheal  anesthesia, he was rolled in prone position on chest rolls and all  pressure points were padded.  His lumbar region was prepped with Avagard  and then DuraPrep and then draped in the usual sterile fashion.  Local  anesthesia 10 mL was injected and his old incision was ellipsed out.  We  carried this down to the lumbosacral fascia.  The fascia was opened.  The paraspinous musculature was taken down in subperiosteal fashion.  I  first exposed the transverse processes of L1 and L2 and then carried the  dissection out laterally to find the L3 and L4 pedicle screws.  I then  identified the L2 transverse processes.  The rod was obviously displaced  from the L4 pedicle screw on the right, but the cap was still in place.  The L3 pedicle screw on the left was obviously loose and was easily  removed.  The L4 pedicle screws were tight.  The L3 pedicle screw on the  left was somewhat loose.  I removed all of the pedicle  screws and then  placed a 7.75 x 45-mm pedicle screw into the L3 pedicle on the right and  7.75 x 50-mm pedicle screws into the L4 pedicles bilaterally.  Once the  7.75 x 50 pedicle screw was placed in the L4 pedicle on the left, we  found this pedicle to be somewhat split.  The screw appeared to be very  tight.  Then we felt we need to carry our instrumentation down to L5 to  get another segment of fixation.  We did not feel we needed to that on  the patient's right side where we had solid pedicle screws.  We  localized the pedicle screw entry zones at L2 and probed these pedicle  with a pedicle probe utilizing AP and lateral fluoroscopy.  We then  tapped each pedicle with a 5.5 tap and then placed 6.75 x 40 x 50-mm  pedicle screws into the L2 pedicles bilaterally utilizing fluoroscopic  guidance.  I could not put a new pedicle screw into the L3 pedicle on  the left.  The hole was too large to accept another pedicle screw.  We  did find the old pedicle screw hole at L5 on the left and placed another  6.75 x 45-mm pedicle screw there.  Therefore, we were able to achieve  segmental fixation on both sides.  The tangent interbody bone wedge was  found to be extruded on the patient's right side.  This was easily  tapped back into the interspace at L3-L4 on the right under fluoroscopic  guidance.  I decorticated transverse processes, placed a mixture of  Actifuse putty out over these at L2-L3.  I then placed lordotic rods  into the multiaxial screw heads and locked these into position with  locking caps and anti-torque device.  I did achieve some compression of  the graft on the right at L3-L4.  I then irrigated with saline solution  containing bacitracin, dried all bleeding points, placed a medium  Hemovac drain through a separate stab incision.  I then closed the  fascia with zero Vicryl, closed the subcuticular tissue with 2-0 and 3-0  Vicryl, and closed the skin with Benzoin and  Steri-Strips.  The drapes  were removed.  A sterile dressing was applied.  The patient was awakened  from general anesthesia and transferred to the  recovery room in stable  condition.  At the end of the procedure, all sponge, needle, and  instrument counts were correct.      Tia Alert, MD  Electronically Signed     DSJ/MEDQ  D:  08/19/2008  T:  08/20/2008  Job:  [id number]

## 2010-08-01 NOTE — H&P (Signed)
Jose Farrell, Jose Farrell               ACCOUNT NO.:  [id number]   MEDICAL RECORD NO.:  [id number]          PATIENT TYPE:  INP   LOCATION:  5120                         FACILITY:  [facility]   PHYSICIAN:  Marlowe Kays, M.D.  DATE OF BIRTH:  1936-01-16   DATE OF ADMISSION:  09/24/2007  DATE OF DISCHARGE:                              HISTORY & PHYSICAL   CHIEF COMPLAINT:  Pain in the left hip.   HISTORY OF PRESENT ILLNESS:  This 75 year old white male was at his home  last evening around 7 o'clock and was tripped over a cord in the lounge  room and fell.  He struck his head against cabinet and was carrying a  spoon in his left hand and fell over this hand on the spoon protecting  himself.  He also struck his left hip.  He had pain in all 3 areas, was  ambulating and felt a pain in his hip, but tried to shake it off.  This morning when he awoke, he had marked pain into his left hip and  could hardly move it.  Initially, ambulance was called.  He was brought  to [facility] emergency room around 1 o'clock today where x-rays revealed  negative head scan, negative left hand for fracture, and a nondisplaced  femoral neck fracture of the left hip.  This patient is in relatively  good health, and when we saw him in the emergency room, he was awake and  alert.   PAST MEDICAL HISTORY:  Dr. Dossie Arbour is his family physician.  He  does have gout and hypercholesterolemia.   CURRENT MEDICATIONS:  1. Allopurinol 300 mg one daily.  2. Fenofibrate 200 mg one daily.   ALLERGIES:  He has no medical allergies.   PAST SURGERIES:  Decompression lumbar laminectomy for spinal stenosis  with fusion.   SOCIAL HISTORY:  The patient has occasional intake of EtOH.  No tobacco.   REVIEW OF SYSTEMS:  Essentially negative other than present illness.  CARDIOVASCULAR:  No chest pain, no angina or orthopnea.  GASTROINTESTINAL:  No nausea, vomiting, or malodorous stool.  RESPIRATORY:  No productive cough, no hemoptysis,  no shortness of  breath.  GENITOURINARY:  No discharge or hematuria.   PHYSICAL EXAMINATION:  GENERAL:  Alert, cooperative, and friendly 47-  year-old white male seen lying on emergency room stretcher in the  hallway.  VITAL SIGNS:  Blood pressure 142/74, pulse 79, respirations 14,  temperature 99.1.  HEENT: Normocephalic, PERRLA.  EOMI intact.  Oropharynx is clear.  CHEST:  Clear to auscultation.  No rhonchi, no rales.  HEART:  Regular rate and rhythm.  No murmurs are heard.  ABDOMEN:  Soft, nontender.  Bowel sounds are heard.  GENITALIA:  Not done and pertinent present illness.  EXTREMITIES:  The patient has no deformity of left lower extremities and  moves his ankle quite well.  Sensory is intact.  He has bruise in the  palm of his left hand.  NEUROLOGICAL:  Grossly intact.   ADMITTING DIAGNOSES:  1. Nondisplaced femoral neck fracture of the left hip.  2. Bruise of the left  palm.  3. Gout.  4. Hypercholesterolemia.  5. Mild contusion to his head.   PLAN:  The patient will undergo reduction of his left hip pinning.  Dr.  Simonne Come explained to both to the patient as well as his family the  surgical procedure and the risks and benefits.      [name].    ______________________________  Marlowe Kays, M.D.    DLU/MEDQ  D:  09/24/2007  T:  09/25/2007  Job:  [id number]   cc:   Barry Dienes. Eloise Harman, M.D.

## 2010-08-04 NOTE — Discharge Summary (Signed)
[name], Jose               ACCOUNT NO.:  [id number]   MEDICAL RECORD NO.:  [id number]          PATIENT TYPE:  INP   LOCATION:  5030                         FACILITY:  [facility]   PHYSICIAN:  Marlowe Kays, M.D.  DATE OF BIRTH:  04-11-1935   DATE OF ADMISSION:  09/24/2007  DATE OF DISCHARGE:  09/27/2007                               DISCHARGE SUMMARY   ADMITTING DIAGNOSES:  1. Nondisplaced femoral neck fracture of the left hip.  2. Gout.  3. Hypercholesterolemia.   DISCHARGE DIAGNOSES:  1. Nondisplaced femoral neck fracture of the left hip.  2. Gout.  3. Hypercholesterolemia.   OPERATION:  On September 24, 2007, the patient underwent closed reduction,  left hip pinning with 3 ACE cannulated screw.   ASSISTANT:  Dooley L. Idolina Primer, [facility]   BRIEF HISTORY:  This 75 year old white male tripped on something in his  home and fell.  He had immediate pain in his left hip.  He was  eventually brought to the emergency room secondary to the continuing  pain in his hip, particularly with standing.  X-ray showed a  nondisplaced femoral neck fracture of the left hip.  I did a head CT,  which was negative.  He also contused his left hand and that was  negative as well as far as any fracture was concerned.  After risks and  benefits of surgery were explained to him, he was taken to the operating  room for the above procedure.  Dr. Eloise Harman continued to follow the  patient while in the hospital.   Laboratory values showed a preoperative hematocrit and hemoglobin to be  completely within normal limits at 17.0 and 49.8, otherwise normal as  well.  Final hemoglobin was 14.4 and hematocrit of 40.1.  Blood  chemistries were normal other than a slightly elevated glucose  (nonfasting) of 156.  X-rays and EKG, not seen on this chart.   CONDITION ON DISCHARGE:  Improved, stable.   PLAN:  The patient had a little difficulty voiding postoperatively.  We  gave him some Flomax, which he did well with  that.  He was discharged  home with Percocet for pain, Robaxin for muscle spasms, to continue with  his allopurinol 300 mg daily, and fenofibrate 200 mg daily.  He is to  call us should he have any temperature greater than  101.  Follow up with Dr. Eloise Harman per his instructions.  He will receive  home health with [facility].  Return to see [place] in 2 weeks after  date of surgery.  He is encouraged to call us should he have any  problems or questions concerning his condition at home.  He is given  aspirin for DVT prophylaxis at home.       [name].       ______________________________  Marlowe Kays, M.D.    DLU/MEDQ  D:  10/26/2007  T:  10/26/2007  Job:  [id number]   cc:   Barry Dienes. Eloise Harman, M.D.  [employer]

## 2010-12-14 LAB — APTT: aPTT: 21 — ABNORMAL LOW

## 2010-12-14 LAB — COMPREHENSIVE METABOLIC PANEL
Albumin: 4.2
BUN: 15
Creatinine, Ser: 1.05
Total Protein: 7.1

## 2010-12-14 LAB — CBC
HCT: 49.8
Hemoglobin: 14.4
Hemoglobin: 15
MCHC: 35.9
MCV: 91
Platelets: 191
RBC: 4.41
RBC: 4.75
RDW: 13.3
RDW: 13.3

## 2010-12-14 LAB — DIFFERENTIAL
Lymphocytes Relative: 9 — ABNORMAL LOW
Lymphs Abs: 0.9
Monocytes Absolute: 0.5
Monocytes Relative: 5
Neutro Abs: 9.1 — ABNORMAL HIGH

## 2010-12-14 LAB — SAMPLE TO BLOOD BANK

## 2010-12-14 LAB — BASIC METABOLIC PANEL
Calcium: 8.2 — ABNORMAL LOW
GFR calc Af Amer: >60
GFR calc non Af Amer: >60
Glucose, Bld: 156 — ABNORMAL HIGH
Sodium: 138

## 2010-12-19 LAB — BASIC METABOLIC PANEL
CO2: 26
Calcium: 9.4
Chloride: 106
Glucose, Bld: 111 — ABNORMAL HIGH
Potassium: 4.2
Sodium: 140

## 2011-01-01 LAB — CBC
HCT: 45.3
Hemoglobin: 15.9
MCHC: 35
MCV: 88.7
RDW: 12.9

## 2011-01-01 LAB — PROTIME-INR: INR: 1

## 2011-01-01 LAB — APTT: aPTT: 28

## 2011-01-01 LAB — DIFFERENTIAL
Basophils Absolute: 0.1
Basophils Relative: 1
Eosinophils Relative: 2
Monocytes Absolute: 0.6

## 2011-01-01 LAB — BASIC METABOLIC PANEL
CO2: 27
Chloride: 104
Glucose, Bld: 99
Potassium: 4.6
Sodium: 137

## 2014-06-28 ENCOUNTER — Ambulatory Visit (INDEPENDENT_AMBULATORY_CARE_PROVIDER_SITE_OTHER): Payer: Medicare Other | Admitting: Neurology

## 2014-06-28 ENCOUNTER — Encounter: Payer: Self-pay | Admitting: Neurology

## 2014-06-28 VITALS — BP 124/68 | HR 57 | Temp 97.6°F | Resp 16 | Ht 67.0 in | Wt 200.0 lb

## 2014-06-28 DIAGNOSIS — G2581 Restless legs syndrome: Secondary | ICD-10-CM | POA: Diagnosis not present

## 2014-06-28 DIAGNOSIS — R351 Nocturia: Secondary | ICD-10-CM

## 2014-06-28 DIAGNOSIS — R0683 Snoring: Secondary | ICD-10-CM | POA: Diagnosis not present

## 2014-06-28 DIAGNOSIS — G4752 REM sleep behavior disorder: Secondary | ICD-10-CM

## 2014-06-28 NOTE — Progress Notes (Signed)
Subjective:    Patient ID: Jose Farrell is a 79 y.o. male.  HPI     [name], MD, PhD [facility] [address], [address] [place], [place] [address]   Dear Dr. Philip Aspen,   I saw your patient, Jose Farrell, upon your kind request in my neurologic clinic today for initial consultation of his sleep disorder, in particular, concern for underlying obstructive sleep apnea. The patient is unaccompanied today. As you know, Jose Farrell is a 79 year old right-handed gentleman with an underlying medical history of depression, gout, hyperlipidemia, vertigo, vitamin D deficiency, reflux disease and low back pain s/p 3 lower back surgeries and s/p L THR, who reports difficulty with his sleep including vivid dreams and dream enactments for perhaps 10-12 years. He has tried trazodone in the past, not in about 1-2 years. He sleep talks some. Some 3 years ago, he fell out of bed. He snores some, only on his back. His wife has OSA and he is familiar with the Dx. He has been on Prozac for years with success.  He is retired, and was a Optometrist. He does not smoke. His wife has never told him that his snoring is loud or that he has apneas. He does not wake up with a gasping sensation and denies morning headaches. He has nocturia twice on an average night. He has some intermittent restless leg symptoms. He has had low back pain and left hip pain intermittently. If he has pain in his sleep onset is difficult. He goes to bed usually between 10 and 12. Falling asleep is not an issue unless he has pain issues. His rise time is 8:52 AM. He wakes up reasonably rested on most days. Sometimes he is tired. He does take a nap often in the afternoon. He does not indicate significant sleepiness on an average day and his Epworth sleepiness score is 5 out of 24 today. He drinks 2 or 3 cups of coffee per day. He does not drink any soda and drinks alcohol occasionally. Usually this is in the form  of beer and no hard liquor. He does not endorse any sleep walking. He does not endorse any sleep paralysis or hallucinations. His dreams are vivid and often he dreams about running away or chasing something which results in arm and leg movements. Symptoms are mildly progressive as he estimates. He does not have symptoms every night and can go several nights without any issues. Trazodone was not helpful and did not have any adverse effect. Since it was not helpful he has not taken it in maybe 2 years.  His Past Medical History Is Significant For: Past Medical History  Diagnosis Date  . Insomnia   . Lower back pain   . Right knee pain   . Gout   . ED (erectile dysfunction)   . GERD (gastroesophageal reflux disease)   . BPH (benign prostatic hyperplasia)   . Hemorrhoids   . Lumbar stenosis   . S/P left knee arthroscopy   . Positional vertigo     His Past Surgical History Is Significant For: Past Surgical History  Procedure Laterality Date  . Lumbar spine surgery  4/10, 6/10  . Total hip arthroplasty    . Knee surgery      His Family History Is Significant For: Family History  Problem Relation Age of Onset  . Diabetes Mother   . Hip fracture Mother   . Kidney disease Father     His Social  History Is Significant For: History   Social History  . Marital Status: Married    Spouse Name: N/A  . Number of Children: 2  . Years of Education: College   Occupational History  . Retired    Social History Main Topics  . Smoking status: Never Smoker   . Smokeless tobacco: Not on file  . Alcohol Use: 0.0 oz/week    0 Standard drinks or equivalent per week     Comment: 1-2 beers a week   . Drug Use: No  . Sexual Activity: Not on file   Other Topics Concern  . None   Social History Narrative    His Allergies Are:  Allergies  Allergen Reactions  . Sulfa Antibiotics   :   His Current Medications Are:  Outpatient Encounter Prescriptions as of 06/28/2014  Medication Sig   . allopurinol (ZYLOPRIM) 300 MG tablet Take 300 mg by mouth daily.  Marland Kitchen aspirin 81 MG tablet Take 81 mg by mouth daily.  Marland Kitchen b complex vitamins tablet Take 1 tablet by mouth daily.  . cycloSPORINE (RESTASIS) 0.05 % ophthalmic emulsion 1 drop 2 (two) times daily.  Marland Kitchen FLUoxetine (PROZAC) 20 MG capsule Take 20 mg by mouth daily.  . meclizine (ANTIVERT) 25 MG tablet   . Omega-3 Fatty Acids (FISH OIL) 1000 MG CAPS Take by mouth.  . pravastatin (PRAVACHOL) 20 MG tablet Take 20 mg by mouth daily.  . tadalafil (CIALIS) 20 MG tablet Take 20 mg by mouth daily as needed for erectile dysfunction.  . tamsulosin (FLOMAX) 0.4 MG CAPS capsule Take 0.4 mg by mouth.  . traZODone (DESYREL) 50 MG tablet Take 50 mg by mouth at bedtime.  . vardenafil (LEVITRA) 20 MG tablet Take 20 mg by mouth daily as needed for erectile dysfunction.  . Vitamin D, Ergocalciferol, (DRISDOL) 50000 UNITS CAPS capsule Take 50,000 Units by mouth every 7 (seven) days.  . [DISCONTINUED] Menthol, Topical Analgesic, 2.5 % LIQD Apply topically.  :  Review of Systems:  Out of a complete 14 point review of systems, all are reviewed and negative with the exception of these symptoms as listed below:  Review of Systems  Constitutional: Positive for fatigue.  HENT: Positive for hearing loss and tinnitus.   Eyes: Positive for pain.  Respiratory:       Snoring   Musculoskeletal:       Joint pain   Neurological:       Restless legs, Vivid dreams (some violent), Snoring, Dizziness, Sometimes take naps during a day, Wakes up feeling "groggy".   Psychiatric/Behavioral:       Not enough sleep     Objective:  Neurologic Exam  Physical Exam Physical Examination:   Filed Vitals:   06/28/14 0847  BP: 124/68  Pulse: 57  Temp: 97.6 F (36.4 C)  Resp: 16    General Examination: The patient is a very pleasant 79 y.o. male in no acute distress. He appears well-developed and well-nourished and well groomed.   HEENT: Normocephalic,  atraumatic, pupils are equal, round and reactive to light and accommodation. Funduscopic exam is normal with sharp disc margins noted. Extraocular tracking is good without limitation to gaze excursion or nystagmus noted. Normal smooth pursuit is noted. Hearing is grossly intact. He is status post bilateral cataract repairs and he has hearing aids in place. Tympanic membranes are clear bilaterally. Face is symmetric with normal facial animation and normal facial sensation. Speech is clear with no dysarthria noted. There is no hypophonia. There is  no lip, neck/head, jaw or voice tremor. Neck is supple with full range of passive and active motion. There are no carotid bruits on auscultation. Oropharynx exam reveals: mild mouth dryness, good dental hygiene and mild airway crowding, due to redundant soft palate and wider tongue. Tonsils seem absent bilaterally. Mallampati is class II. Tongue protrudes centrally and palate elevates symmetrically. Neck size is 16-1/2 inches. He has a Mild overbite. Nasal inspection reveals no significant nasal mucosal bogginess or redness and no septal deviation.   Chest: Clear to auscultation without wheezing, rhonchi or crackles noted.  Heart: S1+S2+0, regular and normal without murmurs, rubs or gallops noted.   Abdomen: Soft, non-tender and non-distended with normal bowel sounds appreciated on auscultation.  Extremities: There is no pitting edema in the distal lower extremities bilaterally. Pedal pulses are intact.  Skin: Warm and dry without trophic changes noted. There are no varicose veins.  Musculoskeletal: exam reveals no obvious joint deformities, tenderness or joint swelling or erythema.   Neurologically:  Mental status: The patient is awake, alert and oriented in all 4 spheres. His immediate and remote memory, attention, language skills and fund of knowledge are appropriate. There is no evidence of aphasia, agnosia, apraxia or anomia. Speech is clear with normal  prosody and enunciation. Thought process is linear. Mood is normal and affect is normal.  Cranial nerves II - XII are as described above under HEENT exam. In addition: shoulder shrug is normal with equal shoulder height noted. Motor exam: Normal bulk, strength and tone is noted. There is no drift, tremor or rebound. Romberg is negative. Reflexes are 2+ throughout. Babinski: Toes are flexor bilaterally. Fine motor skills and coordination: intact with normal finger taps, normal hand movements, normal rapid alternating patting, normal foot taps and normal foot agility.  Cerebellar testing: No dysmetria or intention tremor on finger to nose testing. Heel to shin is unremarkable bilaterally. There is no truncal or gait ataxia.  Sensory exam: intact to light touch, pinprick, vibration, temperature sense in the upper and lower extremities.  Gait, station and balance: He stands easily. No veering to one side is noted. No leaning to one side is noted. Posture is age-appropriate and stance is narrow based. Gait shows normal stride length and normal pace. No problems turning are noted. He turns en bloc. Tandem walk is ever so slightly difficult for him.                Assessment and Plan:  In summary, Jose Farrell is a very pleasant 79 y.o.-year old male with an underlying medical history of depression, gout, hyperlipidemia, vertigo, vitamin D deficiency, reflux disease and low back pain s/p 3 lower back surgeries and s/p L THR, who reports difficulty with his sleep including vivid dreams and dream enactments for perhaps 10-12 years. His history is in keeping with REM behavior disorder. In addition he endorses mild restless leg symptoms and snoring. I would like to proceed with sleep study to help with diagnosis and rule out other organic sleep disturbances such as sleep apnea and periodic leg movements. I talked to the patient about the diagnosis of RBD and its implications. Sometimes it is associated with  neurodegenerative condition such as Parkinson's disease. On examination he has no evidence of parkinsonism and I reassured them in that regard. I also explained to him that there is no curative treatment but sometimes feels apply symptomatic treatment in the form of clonazepam for example. I explained the sleep test procedure to the patient and  he is agreeable to coming back for sleep study. I will see him back afterwards. We also talked about obstructive sleep apnea and he is familiar with the diagnosis of the treatment option with CPAP. We also talked about restless leg syndrome but this does not seem to be a big player at this time. Of note, he occasionally takes tramadol for pain but an average no more than once a month or so. He is advised to start taking his Prozac in the morning as opposed to at night. I answered all his questions today and the patient was in agreement. I encouraged him to call with any interim questions, concerns, problems or updates.   Thank you very much for allowing me to participate in the care of this nice patient. If I can be of any further assistance to you please do not hesitate to call me at [phone].  Sincerely,   [name], MD, PhD

## 2014-06-28 NOTE — Patient Instructions (Addendum)
You may have a condition called REM behavior disorder (RBD). This means, that you have a tendency to act out your drains in your sleep. The frequency of this problem is highly variable and may range from night to night episodes to going days and weeks without any problems. This condition may result in sleep disruption, and therefore daytime symptoms such as lack of energy, problems focusing and concentrating and daytime sleepiness. It may result in self injury if you flail your arms and legs and even roll out of bed but also injury to your bed partner if you accidentally grab, or punch or hit your bed partner. Making your sleep environment safe for you and your bed partner is important. We do not actually know what causes this condition. It can be at times linked to either neurological conditions, including neuro degenerative diseases such as Parkinson's disease (PD). We do not understand fully how RBD and Parkinson's disease or related and why RBD may occur at times many years before Parkinson's disease develops and some cases. Having RBD certainly does not mean he will go on to develop Parkinson's disease. Nevertheless, there may be up to 40% correlation between RBD and PD.    Switch your prozac to morning as it tends to be activating, rather than sedating.

## 2014-07-06 ENCOUNTER — Ambulatory Visit (INDEPENDENT_AMBULATORY_CARE_PROVIDER_SITE_OTHER): Payer: Medicare Other | Admitting: Neurology

## 2014-07-06 VITALS — BP 149/67

## 2014-07-06 DIAGNOSIS — G4733 Obstructive sleep apnea (adult) (pediatric): Secondary | ICD-10-CM

## 2014-07-06 NOTE — Sleep Study (Signed)
Please see the scanned sleep study interpretation located in the Procedure tab within the Chart Review section. 

## 2014-07-15 ENCOUNTER — Telehealth: Payer: Self-pay | Admitting: Neurology

## 2014-07-15 DIAGNOSIS — G4733 Obstructive sleep apnea (adult) (pediatric): Secondary | ICD-10-CM

## 2014-07-15 NOTE — Telephone Encounter (Signed)
Beverlee Nims:   Please call and notify patient that the recent sleep study confirmed the diagnosis of OSA. He did well with CPAP during the study with significant improvement of the respiratory events. Therefore, I would like start the patient on CPAP at home. I placed the order in the chart. The patient will need a follow up appointment with me in 8 to 10 weeks post set up that has to be scheduled; please go ahead and schedule while you have the patient on the phone and make sure patient understands the importance of keeping this window for the FU appointment, as it is often an insurance requirement and failing to adhere to this may result in losing coverage for sleep apnea treatment. 15 min follow-up should suffice, unless there is a 30 min FU slot available.  Please re-enforce the importance of compliance with treatment and the need for [place] to monitor compliance data - again an insurance requirement.  Also remind patient, that any upcoming CPAP machine or mask issues, should be first addressed with the DME company. Please ask if patient has a preference regarding DME company and arrange for CPAP set up at home through a DME company of patient's choice - once you have spoken to patient and scheduled the FU appt (or the new patient consultation) sent off DME order, you can close this encounter, thanks,   [name], MD, PhD [facility]

## 2014-07-16 NOTE — Telephone Encounter (Signed)
Left message for patient to call me back for sleep study results.

## 2014-07-16 NOTE — Telephone Encounter (Signed)
Left message to call back, see telephone note

## 2014-07-19 NOTE — Telephone Encounter (Signed)
Patient is aware of sleep study results. Before he proceeds with getting CPAP, he would like to know if using the CPAP machine will help his problem with vivid dreams and limb movement?

## 2014-07-19 NOTE — Telephone Encounter (Signed)
Treating his sleep apnea may indeed improve his other sleep related problems, but it is hard to predict. Most patients do improve overall in their sleep. Keep is mind, due to disrupted sleep from sleep apnea, he did not have much dream sleep and therefore, no dream enactments during this sleep study.

## 2014-07-19 NOTE — Telephone Encounter (Signed)
Called both numbers and left message to call back

## 2014-07-20 ENCOUNTER — Telehealth: Payer: Self-pay | Admitting: Neurology

## 2014-07-20 NOTE — Telephone Encounter (Signed)
Pt returned your phone call to receive details about sleep study results. Pt states that if he is not available you can discuss results with his wife Braysen Cloward. Please contact the pt and advise.

## 2014-07-20 NOTE — Telephone Encounter (Signed)
See other note, pt returned call and stated if he is not available to give his wife, Almyra Free, the information.

## 2014-07-20 NOTE — Telephone Encounter (Signed)
Duplicate telephone note.  

## 2014-07-20 NOTE — Telephone Encounter (Signed)
I spoke to patient. He is aware of results and the recommendations. He has [employer] for [employer]. He asked to be sent to [facility] for CPAP supplies because his wife uses them for supplies. Referral sent to [facility] at [facility].

## 2014-07-23 ENCOUNTER — Telehealth: Payer: Self-pay | Admitting: Neurology

## 2014-07-23 NOTE — Telephone Encounter (Signed)
Patient returning your call.

## 2014-07-26 NOTE — Telephone Encounter (Signed)
I spoke with the patient. I explained that I have not called him since we last spoke. He had no new questions.

## 2014-10-07 ENCOUNTER — Telehealth: Payer: Self-pay | Admitting: Neurology

## 2014-10-07 NOTE — Telephone Encounter (Signed)
Patient wants to know when he should have his next follow up appt.

## 2014-10-08 NOTE — Telephone Encounter (Signed)
Left message that he should make appt as soon as he can. Advised him to call back and make appt.

## 2014-11-15 NOTE — Telephone Encounter (Signed)
I spoke to patient and he stated that his wife already has an appt with [place]. He did not want to come in sooner. Patient was asking about new supplies, I will send Gwinda Passe with [facility] a message.

## 2014-11-15 NOTE — Telephone Encounter (Signed)
Patient called to schedule f/u appt from sleep study done in April. Patient states he's called several times to schedule this and no one has ever called him back. The company he ordered supplies through won't fill order until he goes back to see Dr.Dohmeier. Please advise.

## 2014-11-15 NOTE — Telephone Encounter (Signed)
Correction, follow up with Dr. Rexene Alberts.

## 2014-12-08 ENCOUNTER — Encounter (INDEPENDENT_AMBULATORY_CARE_PROVIDER_SITE_OTHER): Payer: Self-pay

## 2014-12-08 ENCOUNTER — Encounter: Payer: Self-pay | Admitting: Neurology

## 2014-12-08 ENCOUNTER — Ambulatory Visit (INDEPENDENT_AMBULATORY_CARE_PROVIDER_SITE_OTHER): Payer: Medicare Other | Admitting: Neurology

## 2014-12-08 VITALS — BP 138/76 | HR 60 | Resp 16 | Ht 67.0 in | Wt 212.0 lb

## 2014-12-08 DIAGNOSIS — Z9989 Dependence on other enabling machines and devices: Secondary | ICD-10-CM

## 2014-12-08 DIAGNOSIS — G4733 Obstructive sleep apnea (adult) (pediatric): Secondary | ICD-10-CM | POA: Diagnosis not present

## 2014-12-08 DIAGNOSIS — G4752 REM sleep behavior disorder: Secondary | ICD-10-CM | POA: Diagnosis not present

## 2014-12-08 MED ORDER — CLONAZEPAM 0.5 MG PO TABS: ORAL_TABLET | ORAL | Status: DC

## 2014-12-08 NOTE — Patient Instructions (Signed)
For your dream enactments, let's try low dose Klonopin (generic: clonazepam): 0.25 mg, take one pill each bedtime. Common side effects include sedation or sleepiness, balance problem, personality changes. It may be habit forming.   Please continue using your CPAP regularly. While your insurance requires that you use CPAP at least 4 hours each night on 70% of the nights, I recommend, that you not skip any nights and use it throughout the night if you can. Getting used to CPAP and staying with the treatment long term does take time and patience and discipline. Untreated obstructive sleep apnea when it is moderate to severe can have an adverse impact on cardiovascular health and raise her risk for heart disease, arrhythmias, hypertension, congestive heart failure, stroke and diabetes. Untreated obstructive sleep apnea causes sleep disruption, nonrestorative sleep, and sleep deprivation. This can have an impact on your day to day functioning and cause daytime sleepiness and impairment of cognitive function, memory loss, mood disturbance, and problems focussing. Using CPAP regularly can improve these symptoms.  Keep up the good work! I will see you back in 6 months for sleep apnea check up and RBD check up.

## 2014-12-08 NOTE — Progress Notes (Signed)
Subjective:    Patient ID: Jose Farrell is a 79 y.o. male.  HPI     Interim history:   Jose Farrell is a 79 year old right-handed gentleman with an underlying medical history of depression, gout, hyperlipidemia, vertigo, vitamin D deficiency, reflux disease and low back pain s/p 3 lower back surgeries and s/p L THR, who presents for follow-up consultation of his obstructive sleep apnea, after his recent sleep study. The patient is unaccompanied today. I first met him on 06/28/2014 at the request of his primary care physician, at which time the patient reported difficulty with his sleep including vivid dreams, dream enactments, snoring and difficulty maintaining sleep. I invited him back for sleep study. He had a split-night sleep study on 07/06/2014 and went over his test results with him in detail today. Sleep efficiency was reduced at 62% at baseline with a latency to sleep of 68 minutes and wake after sleep onset of 10.5 minutes. He had an increased percentage of stage II sleep and absence of slow-wave sleep and REM sleep. He had no significant PLMS or EKG changes. He had no significant EEG changes. He had mild to moderate snoring. Total AHI was 29.1 per hour, average oxygen saturation 91%, nadir was 85%. He was then titrated on CPAP. Sleep efficiency was 51.1%. He had an increased percentage of stage II sleep, absence of slow-wave sleep and 20.1% of REM sleep. Average oxygen saturation was 92%, nadir was 91%. CPAP was titrated from 5 cm to 6 cm. AHI was 0 per hour on 6 cm of pressure. Based on his test results I prescribed CPAP therapy for home use.  Today, 12/08/2014: I reviewed his CPAP compliance data from 11/07/2014 through 12/06/2014 which is a total of 30 days during which time he used his machine every night with percent used days greater than 4 hours at 100%, indicating superb compliance, average usage of 8 hours and 53 minutes, residual AHI low at 0.9 per hour, leaked low with the 95th  percentile at 4.4 L/m on a pressure of 6 cm with EPR.  Today, 12/08/2014: He reports feeling better with respect to his sleep. He is pleased with how he is doing with CPAP. He feels better rested. As far as his dream activity, he still has arm and leg movements in his sleep. He has thankfully not fallen out of bed except for 1 time in the past. His wife still notices his movements. He still has vivid dreams. These are not typically pleasant dreams. He does not take trazodone typically. He does not take meclizine typically. He takes Prozac 20 mg generic once daily in the evening. He has wondered about taking it in the mornings. He is compliant with CPAP therapy and has no complaints about it.  Previously:  06/28/2014: He reports difficulty with his sleep including vivid dreams and dream enactments for perhaps 10-12 years. He has tried trazodone in the past, not in about 1-2 years. He sleep talks some. Some 3 years ago, he fell out of bed. He snores some, only on his back. His wife has OSA and he is familiar with the Dx. He has been on Prozac for years with success.   He is retired, and was a Optometrist. He does not smoke. His wife has never told him that his snoring is loud or that he has apneas. He does not wake up with a gasping sensation and denies morning headaches. He has nocturia twice on an average night. He has some intermittent restless  leg symptoms. He has had low back pain and left hip pain intermittently. If he has pain in his sleep onset is difficult. He goes to bed usually between 10 and 12. Falling asleep is not an issue unless he has pain issues. His rise time is 8:52 AM. He wakes up reasonably rested on most days. Sometimes he is tired. He does take a nap often in the afternoon. He does not indicate significant sleepiness on an average day and his Epworth sleepiness score is 5 out of 24 today. He drinks 2 or 3 cups of coffee per day. He does not drink any soda and drinks alcohol occasionally.  Usually this is in the form of beer and no hard liquor. He does not endorse any sleep walking. He does not endorse any sleep paralysis or hallucinations. His dreams are vivid and often he dreams about running away or chasing something which results in arm and leg movements. Symptoms are mildly progressive as he estimates. He does not have symptoms every night and can go several nights without any issues. Trazodone was not helpful and did not have any adverse effect. Since it was not helpful he has not taken it in maybe 2 years.   His Past Medical History Is Significant For: Past Medical History  Diagnosis Date  . Insomnia   . Lower back pain   . Right knee pain   . Gout   . ED (erectile dysfunction)   . GERD (gastroesophageal reflux disease)   . BPH (benign prostatic hyperplasia)   . Hemorrhoids   . Lumbar stenosis   . S/P left knee arthroscopy   . Positional vertigo     His Past Surgical History Is Significant For: Past Surgical History  Procedure Laterality Date  . Lumbar spine surgery  4/10, 6/10  . Total hip arthroplasty    . Knee surgery      His Family History Is Significant For: Family History  Problem Relation Age of Onset  . Diabetes Mother   . Hip fracture Mother   . Kidney disease Father     His Social History Is Significant For: Social History   Social History  . Marital Status: Married    Spouse Name: N/A  . Number of Children: 2  . Years of Education: College   Occupational History  . Retired    Social History Main Topics  . Smoking status: Never Smoker   . Smokeless tobacco: None  . Alcohol Use: 0.0 oz/week    0 Standard drinks or equivalent per week     Comment: 1-2 beers a week   . Drug Use: No  . Sexual Activity: Not Asked   Other Topics Concern  . None   Social History Narrative    His Allergies Are:  Allergies  Allergen Reactions  . Sulfa Antibiotics   :   His Current Medications Are:  Outpatient Encounter Prescriptions as of  12/08/2014  Medication Sig  . allopurinol (ZYLOPRIM) 300 MG tablet Take 300 mg by mouth daily.  Marland Kitchen aspirin 81 MG tablet Take 81 mg by mouth daily.  Marland Kitchen b complex vitamins tablet Take 1 tablet by mouth daily.  . cycloSPORINE (RESTASIS) 0.05 % ophthalmic emulsion 1 drop 2 (two) times daily.  Marland Kitchen FLUoxetine (PROZAC) 20 MG capsule Take 20 mg by mouth daily.  . meclizine (ANTIVERT) 25 MG tablet   . Omega-3 Fatty Acids (FISH OIL) 1000 MG CAPS Take by mouth.  . pravastatin (PRAVACHOL) 20 MG tablet Take 20 mg  by mouth daily.  . tadalafil (CIALIS) 20 MG tablet Take 20 mg by mouth daily as needed for erectile dysfunction.  . tamsulosin (FLOMAX) 0.4 MG CAPS capsule Take 0.4 mg by mouth.  . traZODone (DESYREL) 50 MG tablet Take 50 mg by mouth at bedtime.  . vardenafil (LEVITRA) 20 MG tablet Take 20 mg by mouth daily as needed for erectile dysfunction.  . Vitamin D, Ergocalciferol, (DRISDOL) 50000 UNITS CAPS capsule Take 50,000 Units by mouth every 7 (seven) days.  . clonazePAM (KLONOPIN) 0.5 MG tablet Take 1/2 pill each bedtime for about 2 weeks, then may increase to 1 pill each bedtime.   No facility-administered encounter medications on file as of 12/08/2014.  :  Review of Systems:  Out of a complete 14 point review of systems, all are reviewed and negative with the exception of these symptoms as listed below:   Review of Systems  Neurological:       Patient states that he is still dreaming "a lot" during the night. He wonders if there is anything that can be done for this. Jose Farrell needs new supplies for his machine.     Objective:  Neurologic Exam  Physical Exam Physical Examination:   Filed Vitals:   12/08/14 1044  BP: 138/76  Pulse: 60  Resp: 16   General Examination: The patient is a very pleasant 79 y.o. male in no acute distress. He appears well-developed and well-nourished and well groomed.   HEENT: Normocephalic, atraumatic, pupils are equal, round and reactive to light and  accommodation. Funduscopic exam is normal with sharp disc margins noted. Extraocular tracking is good without limitation to gaze excursion or nystagmus noted. Normal smooth pursuit is noted. Hearing is grossly intact. He is status post bilateral cataract repairs and he has hearing aids in place. Face is symmetric with normal facial animation and normal facial sensation. Speech is clear with no dysarthria noted. There is no hypophonia. There is no lip, neck/head, jaw or voice tremor. Neck is supple with full range of passive and active motion. There are no carotid bruits on auscultation. Oropharynx exam reveals: mild mouth dryness, good dental hygiene and mild airway crowding, due to redundant soft palate and wider tongue. Tonsils seem absent bilaterally. Mallampati is class II. Tongue protrudes centrally and palate elevates symmetrically. He has a Mild overbite. Nasal inspection reveals no significant nasal mucosal bogginess or redness and no septal deviation.   Chest: Clear to auscultation without wheezing, rhonchi or crackles noted.  Heart: S1+S2+0, regular and normal without murmurs, rubs or gallops noted.   Abdomen: Soft, non-tender and non-distended with normal bowel sounds appreciated on auscultation.  Extremities: There is no pitting edema in the distal lower extremities bilaterally. Pedal pulses are intact. He has decrease in range of motion of his left knee and left hip.  Skin: Warm and dry without trophic changes noted. There are no varicose veins.  Musculoskeletal: exam reveals no obvious joint deformities, tenderness or joint swelling or erythema.   Neurologically:  Mental status: The patient is awake, alert and oriented in all 4 spheres. His immediate and remote memory, attention, language skills and fund of knowledge are appropriate. There is no evidence of aphasia, agnosia, apraxia or anomia. Speech is clear with normal prosody and enunciation. Thought process is linear. Mood is normal  and affect is normal.  Cranial nerves II - XII are as described above under HEENT exam. In addition: shoulder shrug is normal with equal shoulder height noted. Motor exam: Normal bulk, strength  and tone is noted. There is no drift, tremor or rebound. Romberg is negative. Reflexes are 2+ throughout. Fine motor skills and coordination: intact with normal finger taps, normal hand movements, normal rapid alternating patting, normal foot taps and normal foot agility.  Cerebellar testing: No dysmetria or intention tremor on finger to nose testing. Heel to shin is unremarkable on the right and difficulty with the left. There is no truncal or gait ataxia.  Sensory exam: intact to light touch, pinprick, vibration, temperature sense in the upper and lower extremities.  Gait, station and balance: He stands easily. No veering to one side is noted. No leaning to one side is noted. Posture is age-appropriate and stance is narrow based. Gait shows normal stride length and normal pace. No problems turning are noted. He turns en bloc. Tandem walk is ever so slightly difficult for him.                Assessment and Plan:  In summary, Jose Farrell is a very pleasant 79 year old male with an underlying medical history of depression, gout, hyperlipidemia, vertigo, vitamin D deficiency, reflux disease and low back pain s/p 3 lower back surgeries and s/p L THR, who  presents for follow-up consultation of his history of RBD. He has in the interim also been diagnosed with moderate obstructive sleep apnea and we talked about his split-night sleep study results from April 2016 at length today. I also discussed his compliance data findings. He has been compliant with treatment since he was placed on CPAP therapy. He endorses improved sleep quality and improved daytime energy. His dream enactments are about the same. He has not had any major flailing movements but still has some unpleasant dreams and frustrating dreams he states. He  has movements of his arms and legs. His sleep study did not suggest any significant REM behavior disorder. His baseline portion of the sleep study did not show any REM sleep. He is encouraged to continue with CPAP therapy. For his REM behavior disorder I suggested low-dose clonazepam for symptomatic treatment starting at 0.25 mg each bedtime. I talked to him about the titration and also potential side effects. I provided him with a new prescription today and answered all his questions. I would like to see him back routinely in 6 months, sooner if the need arises. I encouraged him to touch base with [place] over phone or email in about a month. We can pursue a gradual titration of his clonazepam. He is commended for being compliant with CPAP therapy and encouraged to continue with CPAP regularly. He is advised to start taking his Prozac in the morning as opposed to at night, as it can be activating. I spent 25 minutes in total face-to-face time with the patient, more than 50% of which was spent in counseling and coordination of care, reviewing test results, reviewing medication and discussing or reviewing the diagnosis of RBD and OSA, the prognosis and treatment options.

## 2014-12-20 ENCOUNTER — Telehealth: Payer: Self-pay

## 2014-12-20 NOTE — Telephone Encounter (Signed)
I dont see orders for new supplies. Do you want new order?

## 2014-12-20 NOTE — Telephone Encounter (Signed)
Pt has not heard anything regarding cpap supplies that was requested last week. I advised pt that I would ask Dr. Guadelupe Sabin RN and she would call pt back.

## 2014-12-20 NOTE — Telephone Encounter (Signed)
Left message on vm, letting him know that we only need to up date orders once a year. If he needs anything extra, to notify DME, and they will fax [place] order. We then sign and fax back in.

## 2014-12-20 NOTE — Telephone Encounter (Signed)
Needs to talk to DME. Rx for supplies usually needs to be updated once a year only.

## 2014-12-24 ENCOUNTER — Other Ambulatory Visit (HOSPITAL_COMMUNITY): Payer: Self-pay | Admitting: *Deleted

## 2014-12-27 ENCOUNTER — Encounter (HOSPITAL_COMMUNITY): Payer: Self-pay | Attending: Internal Medicine

## 2014-12-28 ENCOUNTER — Other Ambulatory Visit: Payer: Self-pay

## 2014-12-28 DIAGNOSIS — R0683 Snoring: Secondary | ICD-10-CM

## 2014-12-28 DIAGNOSIS — G4752 REM sleep behavior disorder: Secondary | ICD-10-CM

## 2014-12-28 DIAGNOSIS — G4733 Obstructive sleep apnea (adult) (pediatric): Secondary | ICD-10-CM

## 2015-01-13 ENCOUNTER — Encounter (HOSPITAL_COMMUNITY): Payer: Self-pay | Attending: Internal Medicine

## 2015-05-05 ENCOUNTER — Other Ambulatory Visit (HOSPITAL_COMMUNITY): Payer: Self-pay | Admitting: *Deleted

## 2015-05-06 ENCOUNTER — Ambulatory Visit (HOSPITAL_COMMUNITY)
Admission: RE | Admit: 2015-05-06 | Discharge: 2015-05-06 | Disposition: A | Discharge status: Home / Self Care | Payer: Medicare Other | Source: Ambulatory Visit | Attending: Internal Medicine | Admitting: Internal Medicine

## 2015-05-06 DIAGNOSIS — M810 Age-related osteoporosis without current pathological fracture: Secondary | ICD-10-CM | POA: Insufficient documentation

## 2015-05-06 MED ORDER — ZOLEDRONIC ACID 5 MG/100ML IV SOLN
INTRAVENOUS | Status: AC
  Administered 2015-05-06: 5 mg via INTRAVENOUS
  Filled 2015-05-06: qty 100

## 2015-05-06 MED ORDER — ZOLEDRONIC ACID 5 MG/100ML IV SOLN
5.0000 mg | Freq: Once | INTRAVENOUS | Status: AC
  Administered 2015-05-06: 5 mg via INTRAVENOUS

## 2015-05-06 NOTE — Discharge Instructions (Signed)

## 2015-06-07 ENCOUNTER — Ambulatory Visit: Payer: Medicare Other | Admitting: Neurology

## 2015-07-05 ENCOUNTER — Ambulatory Visit (INDEPENDENT_AMBULATORY_CARE_PROVIDER_SITE_OTHER): Payer: Medicare Other | Admitting: Neurology

## 2015-07-05 ENCOUNTER — Telehealth: Payer: Self-pay | Admitting: Neurology

## 2015-07-05 ENCOUNTER — Encounter: Payer: Self-pay | Admitting: Neurology

## 2015-07-05 VITALS — BP 109/53 | HR 52 | Resp 16 | Ht 67.0 in | Wt 212.0 lb

## 2015-07-05 DIAGNOSIS — Z9989 Dependence on other enabling machines and devices: Secondary | ICD-10-CM

## 2015-07-05 DIAGNOSIS — G4733 Obstructive sleep apnea (adult) (pediatric): Secondary | ICD-10-CM | POA: Diagnosis not present

## 2015-07-05 DIAGNOSIS — G4752 REM sleep behavior disorder: Secondary | ICD-10-CM

## 2015-07-05 MED ORDER — CLONAZEPAM 0.5 MG PO TABS: ORAL_TABLET | ORAL | Status: DC

## 2015-07-05 NOTE — Progress Notes (Signed)
Subjective:    Patient ID: Jose Farrell is a 80 y.o. male.  HPI     Interim history:  Jose Farrell is a 80 year old right-handed gentleman with an underlying medical history of depression, gout, hyperlipidemia, vertigo, vitamin D deficiency, reflux disease and low back pain s/p 3 lower back surgeries and s/p L THR, who presents for follow-up consultation of his obstructive sleep apnea on treatment with CPAP at home. The patient is unaccompanied today. I last saw him on 12/08/2014, at which time he reported feeling better. We talked about his sleep test results at the time. We reviewed his compliance data. He was using CPAP regularly and felt improved. He felt better after starting CPAP therapy including feeling better rested. He was having some leg and arm movements in his sleep and vivid dreams. Thankfully, he had not fallen out of bed recently. I suggested we try him on low-dose clonazepam starting at 0.25 mg each night with increased to 0.5 mg after 2 weeks for symptomatic treatment of RBD.  Today, 07/05/2015: I reviewed his CPAP compliance data from 06/04/2015 through 07/03/2015 which is a total of 30 days during which time he used his machine under percent of the time with average usage of . 9 hours and 8 minutes, residual AHI 2.4 per hour, leak low with the 95th percentile at 2.4 L/m on a pressure of 6 cm.  Today, 07/05/2015: He reports that he tried clonazepam and stopped it after being on those 0.5 mg for a couple of weeks as it did not make a difference. He had no side effects. Last night he had particular vivid dreams and had significant arm and leg movements. Thankfully, he has not fallen out of bed recently. CPAP wise he feels good. He feels that it has improved his sleep quality and daytime energy and he is completely compliant with treatment and has no concerns in that regard.  Previously:  I first met him on 06/28/2014 at the request of his primary care physician, at which time the  patient reported difficulty with his sleep including vivid dreams, dream enactments, snoring and difficulty maintaining sleep. I invited him back for sleep study. He had a split-night sleep study on 07/06/2014 and went over his test results with him in detail today. Sleep efficiency was reduced at 62% at baseline with a latency to sleep of 68 minutes and wake after sleep onset of 10.5 minutes. He had an increased percentage of stage II sleep and absence of slow-wave sleep and REM sleep. He had no significant PLMS or EKG changes. He had no significant EEG changes. He had mild to moderate snoring. Total AHI was 29.1 per hour, average oxygen saturation 91%, nadir was 85%. He was then titrated on CPAP. Sleep efficiency was 51.1%. He had an increased percentage of stage II sleep, absence of slow-wave sleep and 20.1% of REM sleep. Average oxygen saturation was 92%, nadir was 91%. CPAP was titrated from 5 cm to 6 cm. AHI was 0 per hour on 6 cm of pressure. Based on his test results I prescribed CPAP therapy for home use.  I reviewed his CPAP compliance data from 11/07/2014 through 12/06/2014 which is a total of 30 days during which time he used his machine every night with percent used days greater than 4 hours at 100%, indicating superb compliance, average usage of 8 hours and 53 minutes, residual AHI low at 0.9 per hour, leaked low with the 95th percentile at 4.4 L/m on a pressure of  6 cm with EPR.  06/28/2014: He reports difficulty with his sleep including vivid dreams and dream enactments for perhaps 10-12 years. He has tried trazodone in the past, not in about 1-2 years. He sleep talks some. Some 3 years ago, he fell out of bed. He snores some, only on his back. His wife has OSA and he is familiar with the Dx. He has been on Prozac for years with success.   He is retired, and was a Optometrist. He does not smoke. His wife has never told him that his snoring is loud or that he has apneas. He does not wake up with a  gasping sensation and denies morning headaches. He has nocturia twice on an average night. He has some intermittent restless leg symptoms. He has had low back pain and left hip pain intermittently. If he has pain in his sleep onset is difficult. He goes to bed usually between 10 and 12. Falling asleep is not an issue unless he has pain issues. His rise time is 8:52 AM. He wakes up reasonably rested on most days. Sometimes he is tired. He does take a nap often in the afternoon. He does not indicate significant sleepiness on an average day and his Epworth sleepiness score is 5 out of 24 today. He drinks 2 or 3 cups of coffee per day. He does not drink any soda and drinks alcohol occasionally. Usually this is in the form of beer and no hard liquor. He does not endorse any sleep walking. He does not endorse any sleep paralysis or hallucinations. His dreams are vivid and often he dreams about running away or chasing something which results in arm and leg movements. Symptoms are mildly progressive as he estimates. He does not have symptoms every night and can go several nights without any issues. Trazodone was not helpful and did not have any adverse effect. Since it was not helpful he has not taken it in maybe 2 years.     His Past Medical History Is Significant For: Past Medical History  Diagnosis Date  . Insomnia   . Lower back pain   . Right knee pain   . Gout   . ED (erectile dysfunction)   . GERD (gastroesophageal reflux disease)   . BPH (benign prostatic hyperplasia)   . Hemorrhoids   . Lumbar stenosis   . S/P left knee arthroscopy   . Positional vertigo     His Past Surgical History Is Significant For: Past Surgical History  Procedure Laterality Date  . Lumbar spine surgery  4/10, 6/10  . Total hip arthroplasty    . Knee surgery      His Family History Is Significant For: Family History  Problem Relation Age of Onset  . Diabetes Mother   . Hip fracture Mother   . Kidney disease  Father     His Social History Is Significant For: Social History   Social History  . Marital Status: Married    Spouse Name: N/A  . Number of Children: 2  . Years of Education: College   Occupational History  . Retired    Social History Main Topics  . Smoking status: Never Smoker   . Smokeless tobacco: None  . Alcohol Use: 0.0 oz/week    0 Standard drinks or equivalent per week     Comment: 1-2 beers a week   . Drug Use: No  . Sexual Activity: Not Asked   Other Topics Concern  . None   Social  History Narrative    His Allergies Are:  Allergies  Allergen Reactions  . Sulfa Antibiotics   :   His Current Medications Are:  Outpatient Encounter Prescriptions as of 07/05/2015  Medication Sig  . allopurinol (ZYLOPRIM) 300 MG tablet Take 300 mg by mouth daily.  Marland Kitchen aspirin 81 MG tablet Take 81 mg by mouth daily.  Marland Kitchen b complex vitamins tablet Take 1 tablet by mouth daily.  . cycloSPORINE (RESTASIS) 0.05 % ophthalmic emulsion 1 drop 2 (two) times daily.  Marland Kitchen FLUoxetine (PROZAC) 20 MG capsule Take 20 mg by mouth daily.  . meclizine (ANTIVERT) 25 MG tablet   . Omega-3 Fatty Acids (FISH OIL) 1000 MG CAPS Take by mouth.  . pravastatin (PRAVACHOL) 20 MG tablet Take 20 mg by mouth daily.  . tamsulosin (FLOMAX) 0.4 MG CAPS capsule Take 0.4 mg by mouth.  . traZODone (DESYREL) 50 MG tablet Take 50 mg by mouth at bedtime.  . Vitamin D, Ergocalciferol, (DRISDOL) 50000 UNITS CAPS capsule Take 50,000 Units by mouth every 7 (seven) days.  . clonazePAM (KLONOPIN) 0.5 MG tablet Take 1 pill each bedtime for 1 week, then may increase to 2 pills each bedtime.  . [DISCONTINUED] clonazePAM (KLONOPIN) 0.5 MG tablet Take 1/2 pill each bedtime for about 2 weeks, then may increase to 1 pill each bedtime.  . [DISCONTINUED] tadalafil (CIALIS) 20 MG tablet Take 20 mg by mouth daily as needed for erectile dysfunction.  . [DISCONTINUED] vardenafil (LEVITRA) 20 MG tablet Take 20 mg by mouth daily as needed for  erectile dysfunction.   No facility-administered encounter medications on file as of 07/05/2015.  :  Review of Systems:  Out of a complete 14 point review of systems, all are reviewed and negative with the exception of these symptoms as listed below:   Review of Systems  Neurological:       Patient states tha the is doing well with CPAP. No new concerns.     Objective:  Neurologic Exam  Physical Exam Physical Examination:   Filed Vitals:   07/05/15 1526  BP: 109/53  Pulse: 52  Resp: 16   General Examination: The patient is a very pleasant 80 y.o. male in no acute distress. He appears well-developed and well-nourished and well groomed.   HEENT: Normocephalic, atraumatic, pupils are equal, round and reactive to light and accommodation. Extraocular tracking is good without limitation to gaze excursion or nystagmus noted. Normal smooth pursuit is noted. Hearing is grossly intact. He is status post bilateral cataract repairs and he has hearing aids in place. Face is symmetric with normal facial animation and normal facial sensation. Speech is clear with no dysarthria noted. There is no hypophonia. There is no lip, neck/head, jaw or voice tremor. Neck is supple with full range of passive and active motion. There are no carotid bruits on auscultation. Oropharynx exam reveals: mild mouth dryness, good dental hygiene and mild airway crowding, due to redundant soft palate and wider tongue. Tonsils seem absent bilaterally. Mallampati is class II. Tongue protrudes centrally and palate elevates symmetrically. He has a Mild overbite.   Chest: Clear to auscultation without wheezing, rhonchi or crackles noted.  Heart: S1+S2+0, regular and normal without murmurs, rubs or gallops noted.   Abdomen: Soft, non-tender and non-distended with normal bowel sounds appreciated on auscultation.  Extremities: There is no pitting edema in the distal lower extremities bilaterally. Pedal pulses are intact. He has  decrease in range of motion of his left knee and left hip.  Skin:  Warm and dry without trophic changes noted. There are no varicose veins.  Musculoskeletal: exam reveals no obvious joint deformities, tenderness or joint swelling or erythema.   Neurologically:  Mental status: The patient is awake, alert and oriented in all 4 spheres. His immediate and remote memory, attention, language skills and fund of knowledge are appropriate. There is no evidence of aphasia, agnosia, apraxia or anomia. Speech is clear with normal prosody and enunciation. Thought process is linear. Mood is normal and affect is normal.  Cranial nerves II - XII are as described above under HEENT exam. In addition: shoulder shrug is normal with equal shoulder height noted. Motor exam: Normal bulk, strength and tone is noted. There is no drift, tremor or rebound. Romberg is negative. Reflexes are 1-2+ throughout. Fine motor skills and coordination: intact with normal finger taps, normal hand movements, normal rapid alternating patting, normal foot taps and normal foot agility.  Cerebellar testing: No dysmetria or intention tremor on finger to nose testing. Heel to shin is unremarkable on the right and difficulty with the left. There is no truncal or gait ataxia.  Sensory exam: intact to light touch in the upper and lower extremities.  Gait, station and balance: He stands easily. No veering to one side is noted. No leaning to one side is noted. Posture is age-appropriate and stance is narrow based. Gait shows normal stride length and normal pace. No problems turning are noted. He turns en bloc. Tandem walk is slightly difficult for him.                Assessment and Plan:  In summary, Keyan L Brisky is a very pleasant 80 year old male with an underlying medical history of depression, gout, hyperlipidemia, vertigo, vitamin D deficiency, reflux disease and low back pain s/p 3 lower back surgeries and s/p L THR, who presents for follow-up  consultation of his history of RBD and OSA on CPAP. He had a split-night sleep study in April 2016. He has done well with CPAP compliance. He feels improved with respect to sleep quality and daytime energy but still has residual REM behavior disorder type changes. His physical exam does not show any obvious parkinsonian features. He tried clonazepam in low dose and did not see an improvement stop the medication after being on 0.5 mg for a couple of weeks. He is encouraged to try it again with 0.5 mg at night for 1 week then increase it to 1 mg each night thereafter. He is encouraged to call me before he decides to stop it. We went over side effects as well. He is also encouraged to email me with any other updates. He is commended for his CPAP compliance and has noted an improvement in his sleep with the exception of ongoing issues with dream enactments for which he is again reminded that there is no specific treatment. His baseline portion of the sleep study did not show any REM sleep. He is encouraged to continue with CPAP therapy. I provided him with a new prescription for clonazepam today. I would like to see him back in 6 months, sooner if needed. I answered all his questions today and he was in agreement. I spent 15 minutes in total face-to-face time with the patient, more than 50% of which was spent in counseling and coordination of care, reviewing test results, reviewing medication and discussing or reviewing the diagnosis of RBD and OSA, the prognosis and treatment options.

## 2015-07-05 NOTE — Telephone Encounter (Signed)
Pt called inquiring if he should bring CPAP to appt today. Operator told him this was not 1st initial visit, he did not need to bring it. Sts he did not bring last OV either.

## 2015-07-05 NOTE — Telephone Encounter (Signed)
Patient is already here for appt.

## 2015-07-05 NOTE — Patient Instructions (Signed)
Let's try the Klonopin (generic: clonazepam) again, on a higher dose: 0.5 mg, take one pill each bedtime for 1 week, then 2 pills each night thereafter. Common side effects include sedation or sleepiness, balance problem, personality changes.   Please continue using your CPAP regularly. While your insurance requires that you use CPAP at least 4 hours each night on 70% of the nights, I recommend, that you not skip any nights and use it throughout the night if you can. Getting used to CPAP and staying with the treatment long term does take time and patience and discipline. Untreated obstructive sleep apnea when it is moderate to severe can have an adverse impact on cardiovascular health and raise her risk for heart disease, arrhythmias, hypertension, congestive heart failure, stroke and diabetes. Untreated obstructive sleep apnea causes sleep disruption, nonrestorative sleep, and sleep deprivation. This can have an impact on your day to day functioning and cause daytime sleepiness and impairment of cognitive function, memory loss, mood disturbance, and problems focussing. Using CPAP regularly can improve these symptoms.  Keep up the good work! I will see you back in 6 months for sleep apnea check up, and if you continue to do well on CPAP I will see you once a year thereafter.

## 2015-07-18 ENCOUNTER — Telehealth: Payer: Self-pay | Admitting: Neurology

## 2015-07-18 NOTE — Telephone Encounter (Signed)
Please ask patient to go back down to 1 tablet each night of the clonazepam which should be 0.5 mg strength. After about a week, maybe he can try to go up to 1-1/2 pills each night which should be 0.75 mg total dose. Please ask him to keep [place] posted.

## 2015-07-18 NOTE — Telephone Encounter (Signed)
I spoke to patient and he is aware of recommendation and voiced understanding.

## 2015-07-18 NOTE — Telephone Encounter (Signed)
Patient called to advise clonazePAM (KLONOPIN) 0.5 MG tablet was increased from 1 to 2 tablets. States it "makes him dizzy, groggy and a little bit depressed". States Dr. Rexene Alberts wanted him to let her know if he were to have these symptoms.

## 2015-08-30 ENCOUNTER — Telehealth: Payer: Self-pay | Admitting: *Deleted

## 2015-08-30 NOTE — Telephone Encounter (Signed)
Pt sleep study, faxed to [facility] on 08/30/15.

## 2016-01-08 ENCOUNTER — Encounter: Payer: Self-pay | Admitting: Neurology

## 2016-01-10 ENCOUNTER — Ambulatory Visit (INDEPENDENT_AMBULATORY_CARE_PROVIDER_SITE_OTHER): Payer: Medicare Other | Admitting: Neurology

## 2016-01-10 ENCOUNTER — Encounter: Payer: Self-pay | Admitting: Neurology

## 2016-01-10 VITALS — BP 126/62 | HR 72 | Resp 16 | Ht 67.0 in | Wt 214.0 lb

## 2016-01-10 DIAGNOSIS — Z9989 Dependence on other enabling machines and devices: Secondary | ICD-10-CM

## 2016-01-10 DIAGNOSIS — G4752 REM sleep behavior disorder: Secondary | ICD-10-CM | POA: Diagnosis not present

## 2016-01-10 DIAGNOSIS — G4733 Obstructive sleep apnea (adult) (pediatric): Secondary | ICD-10-CM

## 2016-01-10 NOTE — Patient Instructions (Signed)
Please continue using your CPAP regularly. While your insurance requires that you use CPAP at least 4 hours each night on 70% of the nights, I recommend, that you not skip any nights and use it throughout the night if you can. Getting used to CPAP and staying with the treatment long term does take time and patience and discipline. Untreated obstructive sleep apnea when it is moderate to severe can have an adverse impact on cardiovascular health and raise her risk for heart disease, arrhythmias, hypertension, congestive heart failure, stroke and diabetes. Untreated obstructive sleep apnea causes sleep disruption, nonrestorative sleep, and sleep deprivation. This can have an impact on your day to day functioning and cause daytime sleepiness and impairment of cognitive function, memory loss, mood disturbance, and problems focussing. Using CPAP regularly can improve these symptoms.  Keep up the good work! I will see you back in 6 months for sleep apnea check up, and if you continue to do well on CPAP I will see you once a year thereafter.   We will continue to monitor your dream enactment symptoms and your restless legs symptoms.

## 2016-01-10 NOTE — Progress Notes (Signed)
Subjective:    Patient ID: Jose Farrell is a 80 y.o. male.  HPI     Interim history:   Jose Farrell is a 80 year old right-handed gentleman with an underlying medical history of depression, gout, hyperlipidemia, vertigo, vitamin D deficiency, reflux disease and low back pain s/p 3 lower back surgeries and s/p L THR, who presents for follow-up consultation of his RBD and obstructive sleep apnea on treatment with CPAP at home. The patient is unaccompanied today. I last saw him on 07/05/2015, at which time he reported that he tried clonazepam but stopped it after a couple weeks as he did not think it made a difference. He had no side effects with it but had no significant dream enactments. He was compliant with CPAP therapy.   Today, 01/10/2016: I reviewed his CPAP compliance data from 12/10/2015 through 01/08/2016, which is a total of 30 days, during which time he used his machine every night with percent used days greater than 4 hours at 100%, indicating superb compliance with an average usage of 9 hours, residual AHI 1.7 per hour, leaked low with the 95th percentile at 8.7 L/m on a pressure of 6 cm with EPR.   Today, 01/10/2016: He reports Doing well with CPAP. His sleep is still good. He has always needed 8-9 hours of sleep to be fully rested. He stopped taking clonazepam. His primary care physician prescribed gabapentin for residual low back pain and for sleep and this has helped consolidate his sleep and his dream enactments are mild and not very bothersome at this time, better than before. He does not have any significant restless leg symptoms. 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 This has helped.   Previously:  I saw him on 12/08/2014, at which time he reported feeling better. We talked about his sleep test results at the time. We reviewed his compliance data. He was using CPAP regularly and felt improved. He felt better after starting CPAP therapy  including feeling better rested. He was having some leg and arm movements in his sleep and vivid dreams. Thankfully, he had not fallen out of bed recently. I suggested we try him on low-dose clonazepam starting at 0.25 mg each night with increased to 0.5 mg after 2 weeks for symptomatic treatment of RBD.   I reviewed his CPAP compliance data from 06/04/2015 through 07/03/2015 which is a total of 30 days during which time he used his machine 100% of the time with average usage of 9 hours and 8 minutes, residual AHI 2.4 per hour, leak low with the 95th percentile at 2.4 L/m on a pressure of 6 cm.   I first met him on 06/28/2014 at the request of his primary care physician, at which time the patient reported difficulty with his sleep including vivid dreams, dream enactments, snoring and difficulty maintaining sleep. I invited him back for sleep study. He had a split-night sleep study on 07/06/2014 and went over his test results with him in detail today. Sleep efficiency was reduced at 62% at baseline with a latency to sleep of 68 minutes and wake after sleep onset of 10.5 minutes. He had an increased percentage of stage II sleep and absence of slow-wave sleep and REM sleep. He had no significant PLMS or EKG changes. He had no significant EEG changes. He had mild to moderate snoring. Total AHI was 29.1 per hour, average oxygen saturation 91%, nadir was 85%. He was then titrated  on CPAP. Sleep efficiency was 51.1%. He had an increased percentage of stage II sleep, absence of slow-wave sleep and 20.1% of REM sleep. Average oxygen saturation was 92%, nadir was 91%. CPAP was titrated from 5 cm to 6 cm. AHI was 0 per hour on 6 cm of pressure. Based on his test results I prescribed CPAP therapy for home use.   I reviewed his CPAP compliance data from 11/07/2014 through 12/06/2014 which is a total of 30 days during which time he used his machine every night with percent used days greater than 4 hours at 100%, indicating  superb compliance, average usage of 8 hours and 53 minutes, residual AHI low at 0.9 per hour, leaked low with the 95th percentile at 4.4 L/m on a pressure of 6 cm with EPR.   06/28/2014: He reports difficulty with his sleep including vivid dreams and dream enactments for perhaps 10-12 years. He has tried trazodone in the past, not in about 1-2 years. He sleep talks some. Some 3 years ago, he fell out of bed. He snores some, only on his back. His wife has OSA and he is familiar with the Dx. He has been on Prozac for years with success.   He is retired, and was a Optometrist. He does not smoke. His wife has never told him that his snoring is loud or that he has apneas. He does not wake up with a gasping sensation and denies morning headaches. He has nocturia twice on an average night. He has some intermittent restless leg symptoms. He has had low back pain and left hip pain intermittently. If he has pain in his sleep onset is difficult. He goes to bed usually between 10 and 12. Falling asleep is not an issue unless he has pain issues. His rise time is 8:52 AM. He wakes up reasonably rested on most days. Sometimes he is tired. He does take a nap often in the afternoon. He does not indicate significant sleepiness on an average day and his Epworth sleepiness score is 5 out of 24 today. He drinks 2 or 3 cups of coffee per day. He does not drink any soda and drinks alcohol occasionally. Usually this is in the form of beer and no hard liquor. He does not endorse any sleep walking. He does not endorse any sleep paralysis or hallucinations. His dreams are vivid and often he dreams about running away or chasing something which results in arm and leg movements. Symptoms are mildly progressive as he estimates. He does not have symptoms every night and can go several nights without any issues. Trazodone was not helpful and did not have any adverse effect. Since it was not helpful he has not taken it in maybe 2 years.   His  Past Medical History Is Significant For: Past Medical History:  Diagnosis Date  . BPH (benign prostatic hyperplasia)   . ED (erectile dysfunction)   . GERD (gastroesophageal reflux disease)   . Gout   . Hemorrhoids   . Insomnia   . Lower back pain   . Lumbar stenosis   . Positional vertigo   . Right knee pain   . S/P left knee arthroscopy     His Past Surgical History Is Significant For: Past Surgical History:  Procedure Laterality Date  . KNEE SURGERY    . LUMBAR SPINE SURGERY  4/10, 6/10  . TOTAL HIP ARTHROPLASTY      Her Family History Is Significant For: Family History  Problem Relation Age  of Onset  . Diabetes Mother   . Hip fracture Mother   . Kidney disease Father     His Social History Is Significant For: Social History   Social History  . Marital status: Married    Spouse name: N/A  . Number of children: 2  . Years of education: College   Occupational History  . Retired    Social History Main Topics  . Smoking status: Never Smoker  . Smokeless tobacco: None  . Alcohol use 0.0 oz/week     Comment: 1-2 beers a week   . Drug use: No  . Sexual activity: Not Asked   Other Topics Concern  . None   Social History Narrative  . None    His Allergies Are:  Allergies  Allergen Reactions  . Sulfa Antibiotics   :   His Current Medications Are:  Outpatient Encounter Prescriptions as of 01/10/2016  Medication Sig  . allopurinol (ZYLOPRIM) 300 MG tablet Take 300 mg by mouth daily.  Marland Kitchen aspirin 81 MG tablet Take 81 mg by mouth daily.  Marland Kitchen b complex vitamins tablet Take 1 tablet by mouth daily.  . cycloSPORINE (RESTASIS) 0.05 % ophthalmic emulsion 1 drop 2 (two) times daily.  Marland Kitchen FLUoxetine (PROZAC) 20 MG capsule Take 20 mg by mouth daily.  Marland Kitchen gabapentin (NEURONTIN) 300 MG capsule   . meclizine (ANTIVERT) 25 MG tablet   . Omega-3 Fatty Acids (FISH OIL) 1000 MG CAPS Take by mouth.  . pravastatin (PRAVACHOL) 20 MG tablet Take 20 mg by mouth daily.  .  tamsulosin (FLOMAX) 0.4 MG CAPS capsule Take 0.4 mg by mouth.  . Vitamin D, Ergocalciferol, (DRISDOL) 50000 UNITS CAPS capsule Take 50,000 Units by mouth every 7 (seven) days.  . traMADol (ULTRAM) 50 MG tablet   . [DISCONTINUED] clonazePAM (KLONOPIN) 0.5 MG tablet Take 1 pill each bedtime for 1 week, then may increase to 2 pills each bedtime.  . [DISCONTINUED] traZODone (DESYREL) 50 MG tablet Take 50 mg by mouth at bedtime.   No facility-administered encounter medications on file as of 01/10/2016.   :  Review of Systems:  Out of a complete 14 point review of systems, all are reviewed and negative with the exception of these symptoms as listed below: Review of Systems  Neurological:       Patient feels that he is doing well overall. Stopped taking clonazepam and Trazodone and takes Gabapentin at night.    Objective:  Neurologic Exam  Physical Exam Physical Examination:   Vitals:   01/10/16 0943  BP: 126/62  Pulse: 72  Resp: 16   General Examination: The patient is a very pleasant 80 y.o. male in no acute distress. He appears well-developed and well-nourished and well groomed.   HEENT: Normocephalic, atraumatic, pupils are equal, round and reactive to light and accommodation. Extraocular tracking is good without limitation to gaze excursion or nystagmus noted. Normal smooth pursuit is noted. Hearing is grossly intact. He is status post bilateral cataract repairs and he has hearing aids in place. Face is symmetric with normal facial animation and normal facial sensation. Speech is clear with no dysarthria noted. There is no hypophonia. There is no lip, neck/head, jaw or voice tremor. Neck is supple with full range of passive and active motion. There are no carotid bruits on auscultation. Oropharynx exam reveals: mild mouth dryness, good dental hygiene and mild airway crowding, due to redundant soft palate and wider tongue. Tonsils seem absent bilaterally. Mallampati is class II. Tongue  protrudes  centrally and palate elevates symmetrically. He has a Mild overbite.   Chest: Clear to auscultation without wheezing, rhonchi or crackles noted.  Heart: S1+S2+0, regular and normal without murmurs, rubs or gallops noted.   Abdomen: Soft, non-tender and non-distended with normal bowel sounds appreciated on auscultation.  Extremities: There is no pitting edema in the distal lower extremities bilaterally. Pedal pulses are intact. He has decrease in range of motion of his left knee and left hip, unchanged.  Skin: Warm and dry without trophic changes noted. There are no varicose veins.  Musculoskeletal: exam reveals no obvious joint deformities, tenderness or joint swelling or erythema, mild pulling through R hip while attempting HTS. L hand well healing scar at base of left thumb and forearm.   Neurologically:  Mental status: The patient is awake, alert and oriented in all 4 spheres. His immediate and remote memory, attention, language skills and fund of knowledge are appropriate. There is no evidence of aphasia, agnosia, apraxia or anomia. Speech is clear with normal prosody and enunciation. Thought process is linear. Mood is normal and affect is normal.  Cranial nerves II - XII are as described above under HEENT exam. In addition: shoulder shrug is normal with equal shoulder height noted. Motor exam: Normal bulk, strength and tone is noted. There is no drift, tremor or rebound. Romberg is negative, except slight sway. Reflexes are 1+ in the UEs and trace in the LEs. Fine motor skills and coordination: intact with normal finger taps, normal hand movements, normal rapid alternating patting, normal foot taps and normal foot agility.  Cerebellar testing: No dysmetria or intention tremor on finger to nose testing. Heel to shin is difficult, L more than R.  Sensory exam: intact to light touch in the upper and lower extremities.  Gait, station and balance: He stands easily. No veering to one  side is noted. No leaning to one side is noted. Posture is age-appropriate and stance is narrow based. Gait shows normal stride length and normal pace. No problems turning are noted. Tandem walk is slightly difficult for him, better than last time.                Assessment and Plan:  In summary, Kham L Devenport is a very pleasant 80 year old male with an underlying medical history of depression, gout, hyperlipidemia, vertigo, vitamin D deficiency, reflux disease and low back pain s/p 3 lower back surgeries and s/p L THR and L knee arthroscopic surgery, who presents for follow-up consultation of his history of RBD and OSA on CPAP. He had a split-night sleep study in April 2016. He has been fully compliant with CPAP and is commended for this. He reported improved sleep quality and daytime energy and currently has mild residual REM behavior disorder type changes. His physical exam does not show any obvious parkinsonian features. He tried clonazepam in low dose and did not see an improvement, stopped the medication after being on 0.5 mg for a couple of weeks. He tried it again after our appointment in April 2017, and stopped it, as it did not make a big difference. He was then given a Rx from his PCP of gabapentin 300 mg qHS for back pain and sleep and this, if anything has helped with sleep consolidation. He is advised to continue to use his CPAP as well as he does. I reviewed his supply order. We will continue to monitor his symptoms for REM behavior disorder as well as restless leg symptoms. At this juncture, I  suggested a one-year checkup, sooner as needed. I answered all his questions today and he was in agreement.  I spent 25 minutes in total face-to-face time with the patient, more than 50% of which was spent in counseling and coordination of care, reviewing test results, reviewing medication and discussing or reviewing the diagnosis of RBD and OSA, the prognosis and treatment options.

## 2016-01-19 ENCOUNTER — Other Ambulatory Visit (HOSPITAL_COMMUNITY): Payer: Self-pay | Admitting: *Deleted

## 2016-01-20 ENCOUNTER — Encounter (HOSPITAL_COMMUNITY): Payer: Self-pay

## 2016-01-20 ENCOUNTER — Ambulatory Visit (HOSPITAL_COMMUNITY)
Admission: RE | Admit: 2016-01-20 | Discharge: 2016-01-20 | Disposition: A | Discharge status: Home / Self Care | Payer: Medicare Other | Source: Ambulatory Visit | Attending: Internal Medicine | Admitting: Internal Medicine

## 2016-01-20 DIAGNOSIS — M810 Age-related osteoporosis without current pathological fracture: Secondary | ICD-10-CM | POA: Insufficient documentation

## 2016-01-20 MED ORDER — ZOLEDRONIC ACID 5 MG/100ML IV SOLN
5.0000 mg | Freq: Once | INTRAVENOUS | Status: AC
  Administered 2016-01-20: 5 mg via INTRAVENOUS

## 2016-01-20 MED ORDER — ZOLEDRONIC ACID 5 MG/100ML IV SOLN
INTRAVENOUS | Status: AC
  Administered 2016-01-20: 5 mg via INTRAVENOUS
  Filled 2016-01-20: qty 100

## 2017-01-09 ENCOUNTER — Ambulatory Visit (INDEPENDENT_AMBULATORY_CARE_PROVIDER_SITE_OTHER): Payer: Medicare Other | Admitting: Neurology

## 2017-01-09 ENCOUNTER — Encounter: Payer: Self-pay | Admitting: Neurology

## 2017-01-09 VITALS — BP 126/72 | HR 54 | Ht 68.0 in | Wt 211.0 lb

## 2017-01-09 DIAGNOSIS — Z9989 Dependence on other enabling machines and devices: Secondary | ICD-10-CM

## 2017-01-09 DIAGNOSIS — G4733 Obstructive sleep apnea (adult) (pediatric): Secondary | ICD-10-CM | POA: Diagnosis not present

## 2017-01-09 NOTE — Patient Instructions (Addendum)
Keep up the good work! You are doing very well.   Please continue using your CPAP regularly. While your insurance requires that you use CPAP at least 4 hours each night on 70% of the nights, I recommend, that you not skip any nights and use it throughout the night if you can. Getting used to CPAP and staying with the treatment long term does take time and patience and discipline. Untreated obstructive sleep apnea when it is moderate to severe can have an adverse impact on cardiovascular health and raise her risk for heart disease, arrhythmias, hypertension, congestive heart failure, stroke and diabetes. Untreated obstructive sleep apnea causes sleep disruption, nonrestorative sleep, and sleep deprivation. This can have an impact on your day to day functioning and cause daytime sleepiness and impairment of cognitive function, memory loss, mood disturbance, and problems focussing. Using CPAP regularly can improve these symptoms.  Please be good with exchanging filters and getting your supplies from [place].   Please follow up in one year with [facility] for sleep apnea.

## 2017-01-09 NOTE — Progress Notes (Signed)
Subjective:    Patient ID: Jose Farrell is a 81 y.o. male.  HPI     Interim history:   Jose Farrell is a 81 year old right-handed gentleman with an underlying medical history of depression, gout, hyperlipidemia, vertigo, vitamin D deficiency, reflux disease and low back pain s/p 3 lower back surgeries and s/p L THR, who presents for follow-up consultation of Jose Farrell RBD and obstructive sleep apnea on treatment with CPAP at home. The patient is unaccompanied today. I last saw Jose Farrell on 01/10/2016, at which time Jose Farrell was fully compliant with CPAP. Jose Farrell had stopped taking Jose Farrell clonazepam and as Jose Farrell was taking gabapentin for Jose Farrell back pain from Jose Farrell primary care physician Jose Farrell felt that Jose Farrell was sleeping quite well. Jose Farrell did not have any significant restless leg symptoms or significant dream enactments.  Today, 01/09/2017: I reviewed Jose Farrell CPAP compliance data from 12/09/2016 through 01/07/2017 which is a total of 30 days, during which time Jose Farrell used Jose Farrell CPAP every night except for 2 days with percent used days greater than 4 hours at 93%, indicating excellent compliance with an average usage of 9 hours and 28 minutes, residual AHI 1.7 per hour, leaked low with the 95th percentile at 3.1 L/m on a pressure of 6 cm with EPR. Jose Farrell reports doing well.   The patient's allergies, current medications, family history, past medical history, past social history, past surgical history and problem list were reviewed and updated as appropriate.   Previously (copied from previous notes for reference):   I saw Jose Farrell on 07/05/2015, at which time Jose Farrell reported that Jose Farrell tried clonazepam but stopped it after a couple weeks as Jose Farrell did not think it made a difference. Jose Farrell had no side effects with it but had no significant dream enactments. Jose Farrell was compliant with CPAP therapy.    I reviewed Jose Farrell CPAP compliance data from 12/10/2015 through 01/08/2016, which is a total of 30 days, during which time Jose Farrell used Jose Farrell machine every night with percent used days  greater than 4 hours at 100%, indicating superb compliance with an average usage of 9 hours, residual AHI 1.7 per hour, leaked low with the 95th percentile at 8.7 L/m on a pressure of 6 cm with EPR.    I saw Jose Farrell on 12/08/2014, at which time Jose Farrell reported feeling better. We talked about Jose Farrell sleep test results at the time. We reviewed Jose Farrell compliance data. Jose Farrell was using CPAP regularly and felt improved. Jose Farrell felt better after starting CPAP therapy including feeling better rested. Jose Farrell was having some leg and arm movements in Jose Farrell sleep and vivid dreams. Thankfully, Jose Farrell had not fallen out of bed recently. I suggested we try Jose Farrell on low-dose clonazepam starting at 0.25 mg each night with increased to 0.5 mg after 2 weeks for symptomatic treatment of RBD.   I reviewed Jose Farrell CPAP compliance data from 06/04/2015 through 07/03/2015 which is a total of 30 days during which time Jose Farrell used Jose Farrell machine 100% of the time with average usage of 9 hours and 8 minutes, residual AHI 2.4 per hour, leak low with the 95th percentile at 2.4 L/m on a pressure of 6 cm.   I first met Jose Farrell on 06/28/2014 at the request of Jose Farrell primary care physician, at which time the patient reported difficulty with Jose Farrell sleep including vivid dreams, dream enactments, snoring and difficulty maintaining sleep. I invited Jose Farrell back for sleep study. Jose Farrell had a split-night sleep study on 07/06/2014 and went over Jose Farrell test results with Jose Farrell in detail today.  Sleep efficiency was reduced at 62% at baseline with a latency to sleep of 68 minutes and wake after sleep onset of 10.5 minutes. Jose Farrell had an increased percentage of stage II sleep and absence of slow-wave sleep and REM sleep. Jose Farrell had no significant PLMS or EKG changes. Jose Farrell had no significant EEG changes. Jose Farrell had mild to moderate snoring. Total AHI was 29.1 per hour, average oxygen saturation 91%, nadir was 85%. Jose Farrell was then titrated on CPAP. Sleep efficiency was 51.1%. Jose Farrell had an increased percentage of stage II sleep, absence of  slow-wave sleep and 20.1% of REM sleep. Average oxygen saturation was 92%, nadir was 91%. CPAP was titrated from 5 cm to 6 cm. AHI was 0 per hour on 6 cm of pressure. Based on Jose Farrell test results I prescribed CPAP therapy for home use.   I reviewed Jose Farrell CPAP compliance data from 11/07/2014 through 12/06/2014 which is a total of 30 days during which time Jose Farrell used Jose Farrell machine every night with percent used days greater than 4 hours at 100%, indicating superb compliance, average usage of 8 hours and 53 minutes, residual AHI low at 0.9 per hour, leaked low with the 95th percentile at 4.4 L/m on a pressure of 6 cm with EPR.   06/28/2014: Jose Farrell reports difficulty with Jose Farrell sleep including vivid dreams and dream enactments for perhaps 10-12 years. Jose Farrell has tried trazodone in the past, not in about 1-2 years. Jose Farrell sleep talks some. Some 3 years ago, Jose Farrell fell out of bed. Jose Farrell snores some, only on Jose Farrell back. Jose Farrell wife has OSA and Jose Farrell is familiar with the Dx. Jose Farrell has been on Prozac for years with success.   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 Jose Farrell does not smoke. Jose Farrell wife has never told Jose Farrell that Jose Farrell snoring is loud or that Jose Farrell has apneas. Jose Farrell does not wake up with a gasping sensation and denies morning headaches. Jose Farrell has nocturia twice on an average night. Jose Farrell has some intermittent restless leg symptoms. Jose Farrell has had low back pain and left hip pain intermittently. If Jose Farrell has pain in Jose Farrell sleep onset is difficult. Jose Farrell goes to bed usually between 10 and 12. Falling asleep is not an issue unless Jose Farrell has pain issues. Jose Farrell rise time is 8:52 AM. Jose Farrell wakes up reasonably rested on most days. Sometimes Jose Farrell is tired. Jose Farrell does take a nap often in the afternoon. Jose Farrell does not indicate significant sleepiness on an average day and Jose Farrell Epworth sleepiness score is 5 out of 24 today. Jose Farrell drinks 2 or 3 cups of coffee per day. Jose Farrell does not drink any soda and drinks alcohol occasionally. Usually this is in the form of beer and no hard liquor. Jose Farrell does not endorse any sleep walking.  Jose Farrell does not endorse any sleep paralysis or hallucinations. Jose Farrell dreams are vivid and often Jose Farrell dreams about running away or chasing something which results in arm and leg movements. Symptoms are mildly progressive as Jose Farrell estimates. Jose Farrell does not have symptoms every night and can go several nights without any issues. Trazodone was not helpful and did not have any adverse effect. Since it was not helpful Jose Farrell has not taken it in maybe 2 years.  Jose Farrell Past Medical History Is Significant For: Past Medical History:  Diagnosis Date  . BPH (benign prostatic hyperplasia)   . ED (erectile dysfunction)   . GERD (gastroesophageal reflux disease)   . Gout   . Hemorrhoids   . Insomnia   . Lower back pain   .  Lumbar stenosis   . Positional vertigo   . Right knee pain   . S/P left knee arthroscopy     Jose Farrell Past Surgical History Is Significant For: Past Surgical History:  Procedure Laterality Date  . KNEE SURGERY    . LUMBAR SPINE SURGERY  4/10, 6/10  . TOTAL HIP ARTHROPLASTY      Jose Farrell Family History Is Significant For: Family History  Problem Relation Age of Onset  . Kidney disease Father   . Diabetes Mother   . Hip fracture Mother     Jose Farrell Social History Is Significant For: Social History   Social History  . Marital status: Married    Spouse name: N/A  . Number of children: 2  . Years of education: College   Occupational History  . Retired    Social History Main Topics  . Smoking status: Never Smoker  . Smokeless tobacco: Never Used  . Alcohol use 0.0 oz/week     Comment: 1-2 beers a week   . Drug use: No  . Sexual activity: Not Asked   Other Topics Concern  . None   Social History Narrative  . None    Jose Farrell Allergies Are:  Allergies  Allergen Reactions  . Sulfa Antibiotics   :   Jose Farrell Current Medications Are:  Outpatient Encounter Prescriptions as of 01/09/2017  Medication Sig  . allopurinol (ZYLOPRIM) 300 MG tablet Take 300 mg by mouth daily.  Marland Kitchen aspirin 81 MG tablet Take  81 mg by mouth daily.  Marland Kitchen b complex vitamins tablet Take 1 tablet by mouth daily.  Marland Kitchen FLUoxetine (PROZAC) 20 MG capsule Take 20 mg by mouth daily.  . Omega-3 Fatty Acids (FISH OIL) 1000 MG CAPS Take by mouth.  . pravastatin (PRAVACHOL) 20 MG tablet Take 20 mg by mouth daily.  . tamsulosin (FLOMAX) 0.4 MG CAPS capsule Take 0.4 mg by mouth.  . traMADol (ULTRAM) 50 MG tablet   . Vitamin D, Ergocalciferol, (DRISDOL) 50000 UNITS CAPS capsule Take 50,000 Units by mouth every 7 (seven) days.  . [DISCONTINUED] cycloSPORINE (RESTASIS) 0.05 % ophthalmic emulsion 1 drop 2 (two) times daily.  . [DISCONTINUED] gabapentin (NEURONTIN) 300 MG capsule   . [DISCONTINUED] meclizine (ANTIVERT) 25 MG tablet    No facility-administered encounter medications on file as of 01/09/2017.   :  Review of Systems:  Out of a complete 14 point review of systems, all are reviewed and negative with the exception of these symptoms as listed below: Review of Systems  Neurological:       Pt presents today to discuss Jose Farrell cpap. Pt reports that things are going well.    Objective:  Neurological Exam  Physical Exam Physical Examination:   Vitals:   01/09/17 0824  BP: 126/72  Pulse: (!) 54   General Examination: The patient is a very pleasant 81 y.o. male in no acute distress. Jose Farrell appears well-developed and well-nourished and well groomed.   HEENT: Normocephalic, atraumatic, pupils are equal, round and reactive to light and accommodation. Dry eyes.  Extraocular tracking is good without limitation to gaze excursion or nystagmus noted. Normal smooth pursuit is noted. Hearing is grossly intact. Jose Farrell is status post bilateral cataract repairs and Jose Farrell has hearing aids in place. Face is symmetric with normal facial animation and normal facial sensation. Speech is clear with no dysarthria noted. There is no hypophonia. There is no lip, neck/head, jaw or voice tremor. Neck is supple with full range of passive and active motion.  Oropharynx exam  reveals: mild mouth dryness, good dental hygiene and mild airway crowding. Tongue protrudes centrally and palate elevates symmetrically.    Chest: Clear to auscultation without wheezing, rhonchi or crackles noted.  Heart: S1+S2+0, regular and normal without murmurs, rubs or gallops noted.   Abdomen: Soft, non-tender and non-distended with normal bowel sounds appreciated on auscultation.  Extremities: There is no pitting edema in the distal lower extremities bilaterally. Jose Farrell has decrease in range of motion of Jose Farrell left knee and left hip, unchanged.  Skin: Warm and dry without trophic changes noted. There are no varicose veins.  Musculoskeletal: exam reveals no obvious joint deformities.   Neurologically:  Mental status: The patient is awake, alert and oriented in all 4 spheres. Jose Farrell immediate and remote memory, attention, language skills and fund of knowledge are appropriate. There is no evidence of aphasia, agnosia, apraxia or anomia. Speech is clear with normal prosody and enunciation. Thought process is linear. Mood is normal and affect is normal.  Cranial nerves II - XII are as described above under HEENT exam. Motor exam: Normal bulk, strength and tone is noted. There is no drift, tremor or rebound. Romberg is negative, except slight sway. Reflexes are 1+ in the UEs and trace in the LEs. Fine motor skills and coordination: intact with normal finger taps, normal hand movements, normal rapid alternating patting, normal foot taps and normal foot agility.  Cerebellar testing: No dysmetria or intention tremor. Sensory exam: intact to light touch in the upper and lower extremities.  Gait, station and balance: Jose Farrell stands easily. No veering to one side is noted. No leaning to one side is noted. Posture is age-appropriate and stance is narrow based. Gait shows normal stride length and normal pace. No problems turning are noted. Tandem walk is good today.                Assessment  and Plan:  In summary, Jose Farrell is a very pleasant 81 year old male with an underlying medical history of depression, gout, hyperlipidemia, vertigo, vitamin D deficiency, reflux disease and low back pain s/p 3 lower back surgeries and s/p L THR and L knee arthroscopic surgery, who presents for follow-up consultation of Jose Farrell history of RBD and OSA on CPAP. Jose Farrell had a split-night sleep study in April 2016. Jose Farrell has been fully compliant with CPAP and is commended for this. Jose Farrell reports ongoing good Results in terms of  Good sleep consolidation, better sleep quality, less daytime tiredness and no significant recent issues with REM behavior disorder or restless leg symptoms. Of note, Jose Farrell tried low-dose clonazepam last year again but did not find it helpful and We will continue to monitor. Jose Farrell has been doing well. Jose Farrell is reminded to make sure Jose Farrell gets updated supplies on a regular basis and that Jose Farrell does not really use the distilled water in the humidifier container and changes the filter every 2 months at the latest. Physical exam is stable. I suggested a one-year checkup with our sleep lab manager for routine follow-up of Jose Farrell well controlled obstructive sleep apnea. I answered all Jose Farrell questions today and Jose Farrell was in agreement.  I spent 20 minutes in total face-to-face time with the patient, more than 50% of which was spent in counseling and coordination of care, reviewing test results, reviewing medication and discussing or reviewing the diagnosis of OSA, its prognosis and treatment options. Pertinent laboratory and imaging test results that were available during this visit with the patient were reviewed by me and considered in  my medical decision making (see chart for details).

## 2017-01-25 ENCOUNTER — Encounter (HOSPITAL_COMMUNITY): Payer: Medicare Other

## 2017-01-31 ENCOUNTER — Other Ambulatory Visit (HOSPITAL_COMMUNITY): Payer: Self-pay

## 2017-02-01 ENCOUNTER — Ambulatory Visit (HOSPITAL_COMMUNITY)
Admission: RE | Admit: 2017-02-01 | Discharge: 2017-02-01 | Disposition: A | Discharge status: Home / Self Care | Payer: Medicare Other | Source: Ambulatory Visit | Attending: Internal Medicine | Admitting: Internal Medicine

## 2017-02-01 DIAGNOSIS — M810 Age-related osteoporosis without current pathological fracture: Secondary | ICD-10-CM | POA: Insufficient documentation

## 2017-02-01 MED ORDER — ZOLEDRONIC ACID 5 MG/100ML IV SOLN
INTRAVENOUS | Status: AC
Start: 2017-02-01 — End: 2017-02-01
  Administered 2017-02-01: 5 mg via INTRAVENOUS
  Filled 2017-02-01: qty 100

## 2017-02-01 MED ORDER — ZOLEDRONIC ACID 5 MG/100ML IV SOLN
5.0000 mg | Freq: Once | INTRAVENOUS | Status: AC
  Administered 2017-02-01: 5 mg via INTRAVENOUS

## 2017-12-18 ENCOUNTER — Encounter: Payer: Self-pay | Admitting: Neurology

## 2018-01-09 ENCOUNTER — Ambulatory Visit: Payer: Medicare Other | Admitting: Neurology

## 2018-01-09 ENCOUNTER — Ambulatory Visit: Payer: Medicare Other

## 2018-02-26 ENCOUNTER — Encounter: Payer: Self-pay | Admitting: Neurology

## 2018-03-03 ENCOUNTER — Encounter: Payer: Self-pay | Admitting: Neurology

## 2018-03-03 ENCOUNTER — Emergency Department (HOSPITAL_COMMUNITY): Admit: 2018-03-03 | Payer: Medicare Other

## 2018-03-03 ENCOUNTER — Encounter (HOSPITAL_COMMUNITY): Payer: Self-pay | Admitting: Emergency Medicine

## 2018-03-03 ENCOUNTER — Ambulatory Visit (INDEPENDENT_AMBULATORY_CARE_PROVIDER_SITE_OTHER): Payer: Medicare Other | Admitting: Neurology

## 2018-03-03 ENCOUNTER — Emergency Department (HOSPITAL_COMMUNITY)
Admission: EM | Admit: 2018-03-03 | Discharge: 2018-03-03 | Disposition: A | Discharge status: Home / Self Care | Payer: Medicare Other | Attending: Emergency Medicine | Admitting: Emergency Medicine

## 2018-03-03 ENCOUNTER — Emergency Department (HOSPITAL_COMMUNITY): Payer: Medicare Other

## 2018-03-03 VITALS — BP 130/68 | HR 56 | Ht 68.0 in | Wt 205.0 lb

## 2018-03-03 DIAGNOSIS — G4733 Obstructive sleep apnea (adult) (pediatric): Secondary | ICD-10-CM | POA: Diagnosis not present

## 2018-03-03 DIAGNOSIS — W19XXXA Unspecified fall, initial encounter: Secondary | ICD-10-CM

## 2018-03-03 DIAGNOSIS — W01198A Fall on same level from slipping, tripping and stumbling with subsequent striking against other object, initial encounter: Secondary | ICD-10-CM | POA: Diagnosis not present

## 2018-03-03 DIAGNOSIS — Y9389 Activity, other specified: Secondary | ICD-10-CM | POA: Diagnosis not present

## 2018-03-03 DIAGNOSIS — S0990XA Unspecified injury of head, initial encounter: Secondary | ICD-10-CM | POA: Diagnosis present

## 2018-03-03 DIAGNOSIS — Y999 Unspecified external cause status: Secondary | ICD-10-CM | POA: Insufficient documentation

## 2018-03-03 DIAGNOSIS — Z9989 Dependence on other enabling machines and devices: Secondary | ICD-10-CM

## 2018-03-03 DIAGNOSIS — S0083XA Contusion of other part of head, initial encounter: Secondary | ICD-10-CM

## 2018-03-03 DIAGNOSIS — G4752 REM sleep behavior disorder: Secondary | ICD-10-CM | POA: Diagnosis not present

## 2018-03-03 DIAGNOSIS — W19XXXS Unspecified fall, sequela: Secondary | ICD-10-CM

## 2018-03-03 DIAGNOSIS — Y9273 Farm field as the place of occurrence of the external cause: Secondary | ICD-10-CM | POA: Diagnosis not present

## 2018-03-03 MED ORDER — TETANUS-DIPHTH-ACELL PERTUSSIS 5-2.5-18.5 LF-MCG/0.5 IM SUSP
0.5000 mL | Freq: Once | INTRAMUSCULAR | Status: DC
  Filled 2018-03-03: qty 0.5

## 2018-03-03 NOTE — ED Provider Notes (Signed)
[facility] DEPT Provider Note   CSN: [id number] Arrival date & time: 03/03/18  1023     History   Chief Complaint Chief Complaint  Patient presents with  . Fall    HPI Jose Farrell is a 82 y.o. male.  HPI Patient presents to the emergency department with injuries following a fall.  The patient states [place]  The patient states that the gait was loose and gave way he fell forward the patient states that he hit the edge of a plastic horse trough.  Patient states he did not lose consciousness.  The patient states he does have an abrasion and contusion around his eye.  Patient states that he did not take any medications prior to arrival for symptoms.  The patient denies chest pain, shortness of breath, headache,blurred vision, neck pain, fever, cough, weakness, numbness, dizziness, anorexia, edema, abdominal pain, nausea, vomiting, diarrhea, rash, back pain, dysuria, hematemesis, bloody stool, near syncope, or syncope. Past Medical History:  Diagnosis Date  . BPH (benign prostatic hyperplasia)   . ED (erectile dysfunction)   . GERD (gastroesophageal reflux disease)   . Gout   . Hemorrhoids   . Insomnia   . Lower back pain   . Lumbar stenosis   . Positional vertigo   . Right knee pain   . S/P left knee arthroscopy     There are no active problems to display for this patient.   Past Surgical History:  Procedure Laterality Date  . KNEE SURGERY    . LUMBAR SPINE SURGERY  4/10, 6/10  . TOTAL HIP ARTHROPLASTY          Home Medications    Prior to Admission medications   Medication Sig Start Date End Date Taking? Authorizing Provider  allopurinol (ZYLOPRIM) 300 MG tablet Take 300 mg by mouth daily.    [provider]  aspirin 81 MG tablet Take 81 mg by mouth daily.    [provider]  b complex vitamins tablet Take 1 tablet by mouth daily.    [provider]  FLUoxetine  (PROZAC) 20 MG capsule Take 20 mg by mouth daily.    [provider]  Omega-3 Fatty Acids (FISH OIL) 1000 MG CAPS Take by mouth.    [provider]  pravastatin (PRAVACHOL) 20 MG tablet Take 20 mg by mouth daily.    [provider]  tamsulosin (FLOMAX) 0.4 MG CAPS capsule Take 0.4 mg by mouth.    [provider]  traMADol Veatrice Bourbon) 50 MG tablet  10/12/15   [provider]  Vitamin D, Ergocalciferol, (DRISDOL) 50000 UNITS CAPS capsule Take 50,000 Units by mouth every 7 (seven) days.    [provider]    Family History Family History  Problem Relation Age of Onset  . Kidney disease Father   . Diabetes Mother   . Hip fracture Mother     Social History Social History   Tobacco Use  . Smoking status: Never Smoker  . Smokeless tobacco: Never Used  Substance Use Topics  . Alcohol use: Yes    Alcohol/week: 0.0 standard drinks    Comment: 1-2 beers a week   . Drug use: No     Allergies   Sulfa antibiotics   Review of Systems Review of Systems All other systems negative except as documented in the HPI. All pertinent positives and negatives as reviewed in the HPI.  Physical Exam Updated Vital Signs  BP 138/72   Pulse 66   Temp 98.4 F (36.9 C) (Oral)   Resp 16   SpO2 98%   Physical Exam Vitals signs and nursing note reviewed.  Constitutional:      General: He is not in acute distress.    Appearance: He is well-developed.  HENT:     Head: Normocephalic.   Eyes:     Pupils: Pupils are equal, round, and reactive to light.  Neck:     Musculoskeletal: Normal range of motion and neck supple.  Cardiovascular:     Rate and Rhythm: Normal rate and regular rhythm.     Heart sounds: Normal heart sounds. No murmur. No friction rub. No gallop.   Pulmonary:     Effort: Pulmonary effort is normal. No respiratory distress.     Breath sounds: Normal breath sounds. No wheezing.  Abdominal:     General: Bowel sounds are normal.  There is no distension.     Palpations: Abdomen is soft.     Tenderness: There is no abdominal tenderness.  Skin:    General: Skin is warm and dry.     Findings: No erythema or rash.  Neurological:     Mental Status: He is alert and oriented to person, place, and time.     Sensory: Sensation is intact. No sensory deficit.     Motor: No abnormal muscle tone.     Coordination: Coordination normal.  Psychiatric:        Behavior: Behavior normal.      ED Treatments / Results  Labs (all labs ordered are listed, but only abnormal results are displayed) Labs Reviewed - No data to display  EKG None  Radiology Ct Head Wo Contrast  Result Date: 03/03/2018 CLINICAL DATA:  Posttraumatic headache after fall today. No loss of consciousness. EXAM: CT HEAD WITHOUT CONTRAST TECHNIQUE: Contiguous axial images were obtained from the base of the skull through the vertex without intravenous contrast. COMPARISON:  CT scan of September 24, 2007. FINDINGS: Brain: Mild chronic ischemic white matter disease is noted. No mass effect or midline shift is noted. Ventricular size is within normal limits. There is no evidence of mass lesion, hemorrhage or acute infarction. Vascular: No hyperdense vessel or unexpected calcification. Skull: Normal. Negative for fracture or focal lesion. Sinuses/Orbits: Right maxillary mucous retention cyst is noted. Other: None. IMPRESSION: Mild chronic ischemic white matter disease. No acute intracranial abnormality seen. Electronically Signed   By: Marijo Conception, M.D.   On: 03/03/2018 13:03    Procedures Procedures (including critical care time)  Medications Ordered in ED Medications  Tdap (BOOSTRIX) injection 0.5 mL (has no administration in time range)     Initial Impression / Assessment and Plan / ED Course  I have reviewed the triage vital signs and the nursing notes.  Pertinent labs & imaging results that were available during my care of the patient were reviewed by me  and considered in my medical decision making (see chart for details).     Patient will need to be followed up by his primary doctor.  The patient does not have any significant injury noted on CT scan.  Patient does not have any issues with extraocular movements.  Told to use ice over the area.  Final Clinical Impressions(s) / ED Diagnoses   Final diagnoses:  None    ED Discharge Orders    None       Dalia Heading, PA-C 03/03/18 1442    Sherwood Gambler, MD 03/03/18  1555  

## 2018-03-03 NOTE — ED Triage Notes (Signed)
Per pt, states he was carrying some stuff thru a fence when he tripped and fell hitting his head on a plastic tub-no LOC-right face abrasions, left eye swollen and bruised-no blood thinners other than a low dose of ASA-states neck pain which started today

## 2018-03-03 NOTE — Addendum Note (Signed)
Addended by: Lester [place] A on: 03/03/2018 10:32 AM   Modules accepted: Orders

## 2018-03-03 NOTE — Progress Notes (Signed)
Subjective:    Patient ID: JAVELL BLACKBURN is a 82 y.o. male.  HPI     Interim history:   Mr. Coldwell is a 82 year old right-handed gentleman with an underlying medical history of depression, gout, hyperlipidemia, vertigo, vitamin D deficiency, reflux disease and low back pain s/p 3 lower back surgeries and s/p L THR, who presents for follow-up consultation of his RBD and obstructive sleep apnea on treatment with CPAP at home. The patient is accompanied by his wife today. I last saw him on 01/09/2017, at which time he was compliant with his CPAP and doing well. He was advised to follow-up routinely in one year.  Today, 03/03/2018: I reviewed his CPAP compliance data from 01/28/2018 through 02/26/2018 which is a total of 30 days, during which time he used his CPAP every night with percent used days greater than 4 hours at 100%, indicating superb compliance with an average usage of 8 hours and 50 minutes, residual AHI at goal at 0.4 per hour, leak on the low side with the 95th percentile at 1.8 L/m on a pressure of 6 cm with EPR of 3. He reports he fell outside yesterday. He needs new supplies. He has not had a new headgear in over 6 months. He hit his head against a plastic horse watering trough. He did not actually lose consciousness but the wind did get knocked out of him and he felt nauseated for a little bit. This was right after lunch and he had a heavy meal. He did not throw up, has not had any sustained headache, does have soreness in the left forehead/temporal area and sustained a big bruise around the left eye. He does not have any double vision and denies any eye pain. He believes he is up-to-date with his tetanus. He does take a baby aspirin. His wife heard him fall but did not actually see it. He has not had any worsening of his dream enactment behavior. He still has intermittent vivid dreams and some dream enactment, thankfully has not fallen out of bed and is compliant with his CPAP, is a  little behind with his supplies he admits. He has had some nasal congestion recurrent. He denies any discolored nasal discharge. He tries to change his filter every 2 months but admits that he would need new supplies through his DME company, [facility].  Previously:  I saw him on 01/10/2016, at which time he was fully compliant with CPAP. He had stopped taking clonazepam and as he was taking gabapentin for his back pain from his primary care physician; he felt that he was sleeping quite well. He did not have any significant restless leg symptoms or significant dream enactments.   I reviewed his CPAP compliance data from 12/09/2016 through 01/07/2017 which is a total of 30 days, during which time he used his CPAP every night except for 2 days with percent used days greater than 4 hours at 93%, indicating excellent compliance with an average usage of 9 hours and 28 minutes, residual AHI 1.7 per hour, leaked low with the 95th percentile at 3.1 L/m on a pressure of 6 cm with EPR.    I saw him on 07/05/2015, at which time he reported that he tried clonazepam but stopped it after a couple weeks as he did not think it made a difference. He had no side effects with it but had no significant dream enactments. He was compliant with CPAP therapy.    I reviewed his CPAP compliance  data from 12/10/2015 through 01/08/2016, which is a total of 30 days, during which time he used his machine every night with percent used days greater than 4 hours at 100%, indicating superb compliance with an average usage of 9 hours, residual AHI 1.7 per hour, leaked low with the 95th percentile at 8.7 L/m on a pressure of 6 cm with EPR.    I saw him on 12/08/2014, at which time he reported feeling better. We talked about his sleep test results at the time. We reviewed his compliance data. He was using CPAP regularly and felt improved. He felt better after starting CPAP therapy including feeling better rested. He was having some  leg and arm movements in his sleep and vivid dreams. Thankfully, he had not fallen out of bed recently. I suggested we try him on low-dose clonazepam starting at 0.25 mg each night with increased to 0.5 mg after 2 weeks for symptomatic treatment of RBD.   I reviewed his CPAP compliance data from 06/04/2015 through 07/03/2015 which is a total of 30 days during which time he used his machine 100% of the time with average usage of 9 hours and 8 minutes, residual AHI 2.4 per hour, leak low with the 95th percentile at 2.4 L/m on a pressure of 6 cm.   I first met him on 06/28/2014 at the request of his primary care physician, at which time the patient reported difficulty with his sleep including vivid dreams, dream enactments, snoring and difficulty maintaining sleep. I invited him back for sleep study. He had a split-night sleep study on 07/06/2014 and went over his test results with him in detail today. Sleep efficiency was reduced at 62% at baseline with a latency to sleep of 68 minutes and wake after sleep onset of 10.5 minutes. He had an increased percentage of stage II sleep and absence of slow-wave sleep and REM sleep. He had no significant PLMS or EKG changes. He had no significant EEG changes. He had mild to moderate snoring. Total AHI was 29.1 per hour, average oxygen saturation 91%, nadir was 85%. He was then titrated on CPAP. Sleep efficiency was 51.1%. He had an increased percentage of stage II sleep, absence of slow-wave sleep and 20.1% of REM sleep. Average oxygen saturation was 92%, nadir was 91%. CPAP was titrated from 5 cm to 6 cm. AHI was 0 per hour on 6 cm of pressure. Based on his test results I prescribed CPAP therapy for home use.   I reviewed his CPAP compliance data from 11/07/2014 through 12/06/2014 which is a total of 30 days during which time he used his machine every night with percent used days greater than 4 hours at 100%, indicating superb compliance, average usage of 8 hours and 53  minutes, residual AHI low at 0.9 per hour, leaked low with the 95th percentile at 4.4 L/m on a pressure of 6 cm with EPR.   06/28/2014: He reports difficulty with his sleep including vivid dreams and dream enactments for perhaps 10-12 years. He has tried trazodone in the past, not in about 1-2 years. He sleep talks some. Some 3 years ago, he fell out of bed. He snores some, only on his back. His wife has OSA and he is familiar with the Dx. He has been on Prozac for years with success.   He is retired, and was a Optometrist. He does not smoke. His wife has never told him that his snoring is loud or that he has apneas. He  does not wake up with a gasping sensation and denies morning headaches. He has nocturia twice on an average night. He has some intermittent restless leg symptoms. He has had low back pain and left hip pain intermittently. If he has pain in his sleep onset is difficult. He goes to bed usually between 10 and 12. Falling asleep is not an issue unless he has pain issues. His rise time is 8:52 AM. He wakes up reasonably rested on most days. Sometimes he is tired. He does take a nap often in the afternoon. He does not indicate significant sleepiness on an average day and his Epworth sleepiness score is 5 out of 24 today. He drinks 2 or 3 cups of coffee per day. He does not drink any soda and drinks alcohol occasionally. Usually this is in the form of beer and no hard liquor. He does not endorse any sleep walking. He does not endorse any sleep paralysis or hallucinations. His dreams are vivid and often he dreams about running away or chasing something which results in arm and leg movements. Symptoms are mildly progressive as he estimates. He does not have symptoms every night and can go several nights without any issues. Trazodone was not helpful and did not have any adverse effect. Since it was not helpful he has not taken it in maybe 2 years.  His Past Medical History Is Significant For: Past  Medical History:  Diagnosis Date  . BPH (benign prostatic hyperplasia)   . ED (erectile dysfunction)   . GERD (gastroesophageal reflux disease)   . Gout   . Hemorrhoids   . Insomnia   . Lower back pain   . Lumbar stenosis   . Positional vertigo   . Right knee pain   . S/P left knee arthroscopy    His Past Surgical History Is Significant For:  His Family History Is Significant For: Family History  Problem Relation Age of Onset  . Kidney disease Father   . Diabetes Mother   . Hip fracture Mother     His Social History Is Significant For: Social History   Socioeconomic History  . Marital status: Married    Spouse name: Not on file  . Number of children: 2  . Years of education: College  . Highest education level: Not on file  Occupational History  . Occupation: Retired  Scientific laboratory technician  . Financial resource strain: Not on file  . Food insecurity:    Worry: Not on file    Inability: Not on file  . Transportation needs:    Medical: Not on file    Non-medical: Not on file  Tobacco Use  . Smoking status: Never Smoker  . Smokeless tobacco: Never Used  Substance and Sexual Activity  . Alcohol use: Yes    Alcohol/week: 0.0 standard drinks    Comment: 1-2 beers a week   . Drug use: No  . Sexual activity: Not on file  Lifestyle  . Physical activity:    Days per week: Not on file    Minutes per session: Not on file  . Stress: Not on file  Relationships  . Social connections:    Talks on phone: Not on file    Gets together: Not on file    Attends religious service: Not on file    Active member of club or organization: Not on file    Attends meetings of clubs or organizations: Not on file    Relationship status: Not on file  Other  Topics Concern  . Not on file  Social History Narrative  . Not on file    His Allergies Are:  Allergies  Allergen Reactions  . Sulfa Antibiotics   :   His Current Medications Are:  Outpatient Encounter Medications as of 03/03/2018   Medication Sig  . allopurinol (ZYLOPRIM) 300 MG tablet Take 300 mg by mouth daily.  Marland Kitchen aspirin 81 MG tablet Take 81 mg by mouth daily.  Marland Kitchen b complex vitamins tablet Take 1 tablet by mouth daily.  Marland Kitchen FLUoxetine (PROZAC) 20 MG capsule Take 20 mg by mouth daily.  . Omega-3 Fatty Acids (FISH OIL) 1000 MG CAPS Take by mouth.  . pravastatin (PRAVACHOL) 20 MG tablet Take 20 mg by mouth daily.  . tamsulosin (FLOMAX) 0.4 MG CAPS capsule Take 0.4 mg by mouth.  . traMADol (ULTRAM) 50 MG tablet   . Vitamin D, Ergocalciferol, (DRISDOL) 50000 UNITS CAPS capsule Take 50,000 Units by mouth every 7 (seven) days.   No facility-administered encounter medications on file as of 03/03/2018.   :  Review of Systems:  Out of a complete 14 point review of systems, all are reviewed and negative with the exception of these symptoms as listed below: Review of Systems  Neurological:       Pt presents today to discuss his cpap. Pt reports that his cpap is going well. Pt sustained a fall yesterday and has a black left eye.    Objective:  Neurological Exam  Physical Exam Physical Examination:   Vitals:   03/03/18 0921  BP: 130/68  Pulse: (!) 56   General Examination: The patient is a very pleasant 82 y.o. male in no acute distress. He appears well-developed and well-nourished and well groomed.   HEENT:Normocephalic, bruise and mild swelling left frontotemporal area above the left eyebrow. Large left eye bruise, extraocular tracking is preserved, pupils reactive. He has no eye pain, no double vision. He denies headache. Hearing is grossly intactWith bilateral hearing aids in place, he is status post bilateral cataract repairs. Face is otherwise symmetric with normal facial animation and normal facial sensation. Speech is clear with no dysarthria noted. There is no hypophonia. There is no lip, neck/head, jaw or voice tremor. Neck is supple with full range of passive and active motion. Oropharynx exam reveals: mild  mouth dryness, good dental hygiene and mild airway crowding. Tongue protrudes centrally and palate elevates symmetrically.    Chest:Clear to auscultation without wheezing, rhonchi or crackles noted.  Heart:S1+S2+0, regular and normal without murmurs, rubs or gallops noted.   Abdomen:Soft, non-tender and non-distended with normal bowel sounds appreciated on auscultation.  Extremities:There is no pitting edema in the distal lower extremities bilaterally. He has decrease in range of motion of his left knee and left hip, unchanged.  Skin: Warm and dry without trophic changes noted.  Musculoskeletal: exam reveals no obvious joint deformities.   Neurologically:  Mental status: The patient is awake, alert and oriented in all 4 spheres. His immediate and remote memory, attention, language skills and fund of knowledge are appropriate. There is no evidence of aphasia, agnosia, apraxia or anomia. Speech is clear with normal prosody and enunciation. Thought process is linear. Mood is normal and affect is normal.  Cranial nerves II - XII are as described above under HEENT exam. Motor exam: Normal bulk, strength and tone is noted. There is no tremor. Fine motor skills and coordination: grossly intact.  Cerebellar testing: No dysmetria or intention tremor. Sensory exam: intact to light touch in  the upper and lower extremities.  Gait, station and balance: He stands easily. No veering to one side is noted. No leaning to one side is noted. Posture is age-appropriate and stance is narrow based. Gait shows normal stride length and normal pace. No problems turning are noted.  Assessment and Plan:  In summary, Briley L Talent is a very pleasant 82 year old male with an underlying medical history of depression, gout, hyperlipidemia, vertigo, vitamin D deficiency, reflux disease and low back pains/p 3 lower back surgeries and s/p L THR and L knee arthroscopic surgery, who presents for follow-up  consultation of his history of RBD and OSA on CPAP. Of notes, he had a split-night sleep study in April 2016. He has been fully compliant with CPAP and is commended for this. He reports ongoing good results in terms of good sleep consolidation, better sleep quality, less daytime tiredness and no significant changes in his intermittent REM behavior disorder or restless leg symptoms. Of note, he tried low-dose clonazepam in 2017 but did not find it helpful and we will continue to monitor. He has been doing wellfrom a sleep apnea standpoint. Unfortunately, he fell yesterday and hit his head. He believes he did not lose any consciousness but had some nausea immediately afterwards. He sustained a large bruise around the left eye. He believes he is up-to-date with his tetanus but is advised to call his PCP for the exact date of his tetanus vaccine. Furthermore, since he does take a baby aspirin and hit his head, he is advised to get checked out in the emergency room, he may need a head CT to rule out a intracranial hemorrhage. Thankfully, his exam is nonfocal and he does not have any headache but it may be better to be safe than sorry. He is agreeable. From a sleep apnea standpoint and sleep disorder standpoint he is doing fairly well and can be seen in one year. He is agreeable to see one of our nurse practitioners, Ward Givens next time.  I answered all their questions today and the patient and his wife were in agreement. He is reminded to stay up-to-date with his supplies and we talked about the importance of cleanliness and replacing CPAP related supplies such as the nasal pillows and the filters as well as the toes and the humidifier chamber more infrequently. I spent 30 minutes in total face-to-face time with the patient, more than 50% of which was spent in counseling and coordination of care, reviewing test results, reviewing medication and discussing or reviewing the diagnosis of OSA, RBD, fall with injury,  the prognosis and treatment options. Pertinent laboratory and imaging test results that were available during this visit with the patient were reviewed by me and considered in my medical decision making (see chart for details).

## 2018-03-03 NOTE — Discharge Instructions (Signed)
Return here as needed.  Your CT scan did not show any abnormality.  Follow-up with your doctor for recheck.  Use ice over the area that is swollen.

## 2018-03-03 NOTE — Progress Notes (Signed)
Order for cpap supplies sent to [facility] via community message in [employer]. Confirmation received that the order transmitted was successful.

## 2018-03-03 NOTE — Patient Instructions (Addendum)
Please continue using your CPAP regularly. While your insurance requires that you use CPAP at least 4 hours each night on 70% of the nights, I recommend, that you not skip any nights and use it throughout the night if you can. Getting used to CPAP and staying with the treatment long term does take time and patience and discipline. Untreated obstructive sleep apnea when it is moderate to severe can have an adverse impact on cardiovascular health and raise her risk for heart disease, arrhythmias, hypertension, congestive heart failure, stroke and diabetes. Untreated obstructive sleep apnea causes sleep disruption, nonrestorative sleep, and sleep deprivation. This can have an impact on your day to day functioning and cause daytime sleepiness and impairment of cognitive function, memory loss, mood disturbance, and problems focussing. Using CPAP regularly can improve these symptoms. We can see you in 1 year, you can see one of our nurse practitioners as you are stable. I will see you after that.  Please get in touch with Dr. Philip Aspen regarding your tetunus vaccination status. I would like for you to get checked out for your fall. You may need a head CT as you hit your head.

## 2018-05-01 ENCOUNTER — Other Ambulatory Visit: Payer: Self-pay | Admitting: Anesthesiology

## 2018-05-07 ENCOUNTER — Other Ambulatory Visit: Payer: Self-pay

## 2018-05-07 ENCOUNTER — Encounter (HOSPITAL_COMMUNITY): Payer: Self-pay | Admitting: *Deleted

## 2018-05-07 NOTE — Progress Notes (Signed)
Pt denies SOB, chest pain, and being under the care of a cardiologist. Pt stated that a stress test was performed > 30 years ago but denies having an echo and cardiac cath. Pt denies having an EKG and chest x ray within the last year. Pt denies recent labs. Pt stated that he stopped taking Aspirin and fish oil. Pt made aware to stop taking vitamins and herbal medications. Do not take any NSAIDs ie: Ibuprofen, Advil, Naproxen (Aleve), Motrin, BC and Goody Powder. Pt verbalized understanding of all pre-op instructions.

## 2018-05-08 NOTE — H&P (Signed)
Jose Farrell is an 83 y.o. male.   Chief Complaint: Back pain with radiation into the legs left worse than right HPI: Jose Farrell has undergone a total of 3 lumbar surgeries, resulting in L2-L5 fusion.  His last surgery was done in 2010. He still continued to have significant left hip pain, with referral to a hip specialist was found to have a fractured hip that subsequently underwent total hip arthroplasty with some improvement in his symptoms.  He still continues to have diffuse back pain, persistent left buttock pain, but also with his back pain radiating through the posterior and posterior lateral thigh, lateral calf, into his left ankle.  EMG's done about 6 months ago did not demonstrate any axonal dropout, or plexopathy.  He did some physical therapy after his last operation in 2010, but plateaued.  He has tried medication management in the past.  Injections today to have been limited, but really nonproductive.  Through a series of acquaintances, patient requested and got a 2nd opinion with Dr. Vertell Limber.  In review of Dr. Vertell Limber note he indicates that patient does not have any structural abnormality for which surgery would be beneficial.  Patient was very interested in spinal cord stimulator therapy in question Dr. Vertell Limber further.   Ultimately he was found to be a good candidate for some spinal cord stimulator trial, underwent the trial with substantially greater than 50% improvement in his symptoms.  He presents today for permanent implantation of a spinal cord stimulator.  Past Medical History:  Diagnosis Date  . Anxiety   . BPH (benign prostatic hyperplasia)   . Cancer ([place])    skin  . ED (erectile dysfunction)   . GERD (gastroesophageal reflux disease)   . Gout   . Hemorrhoids   . Hyperlipidemia   . Insomnia   . Lower back pain   . Lumbar stenosis   . Positional vertigo   . Post laminectomy syndrome   . Right knee pain   . S/P left knee arthroscopy   . Sleep apnea    wears CPAP  .  Wears hearing aid in both ears     Past Surgical History:  Procedure Laterality Date  . CATARACT EXTRACTION W/ INTRAOCULAR LENS  IMPLANT, BILATERAL    . COLONOSCOPY    . KNEE SURGERY    . LUMBAR SPINE SURGERY  4/10, 6/10  . MOHS SURGERY    . TOTAL HIP ARTHROPLASTY    . WISDOM TOOTH EXTRACTION      Family History  Problem Relation Age of Onset  . Kidney disease Father   . Diabetes Mother   . Hip fracture Mother    Social History:  reports that he has never smoked. He has never used smokeless tobacco. He reports current alcohol use. He reports that he does not use drugs.  Allergies:  Allergies  Allergen Reactions  . Sulfa Antibiotics Rash    Medications Prior to Admission  Medication Sig Dispense Refill  . allopurinol (ZYLOPRIM) 300 MG tablet Take 300 mg by mouth daily.    Marland Kitchen aspirin 81 MG tablet Take 81 mg by mouth daily.    Marland Kitchen FLUoxetine (PROZAC) 20 MG capsule Take 20 mg by mouth daily.    . Omega-3 Fatty Acids (FISH OIL) 1000 MG CAPS Take 1 capsule by mouth daily.     . pravastatin (PRAVACHOL) 20 MG tablet Take 20 mg by mouth daily.    . tamsulosin (FLOMAX) 0.4 MG CAPS capsule Take 0.4 mg by mouth.    Marland Kitchen  traMADol (ULTRAM) 50 MG tablet Take 50 mg by mouth 2 (two) times daily as needed for moderate pain.     . Vitamin D, Ergocalciferol, (DRISDOL) 50000 UNITS CAPS capsule Take 50,000 Units by mouth 2 (two) times a week.     Marland Kitchen ibuprofen (ADVIL,MOTRIN) 200 MG tablet Take 400 mg by mouth daily as needed for moderate pain.      Results for orders placed or performed during the hospital encounter of 05/09/18 (from the past 48 hour(s))  CBC     Status: None   Collection Time: 05/09/18  6:33 AM  Result Value Ref Range   WBC 7.8 4.0 - 10.5 K/uL   RBC 4.85 4.22 - 5.81 MIL/uL   Hemoglobin 15.4 13.0 - 17.0 g/dL   HCT 44.8 39.0 - 52.0 %   MCV 92.4 80.0 - 100.0 fL   MCH 31.8 26.0 - 34.0 pg   MCHC 34.4 30.0 - 36.0 g/dL   RDW 13.0 11.5 - 15.5 %   Platelets 199 150 - 400 K/uL   nRBC  0.0 0.0 - 0.2 %    Comment: Performed at [facility], [place] [address], [place], [place] [address]   No results found.  Review of Systems  Constitutional: Negative.   HENT: Negative.   Eyes: Negative.   Respiratory: Negative.   Cardiovascular: Negative.   Gastrointestinal: Negative.   Genitourinary: Negative.   Musculoskeletal: Positive for back pain. Negative for falls and myalgias.  Skin: Negative.   Neurological: Negative.   Endo/Heme/Allergies: Negative.   Psychiatric/Behavioral: Negative.     Blood pressure (!) 143/70, pulse (!) 57, temperature 97.8 F (36.6 C), temperature source Oral, resp. rate 18, height 5\' 8"  (1.727 m), weight 90.7 kg, SpO2 95 %. Physical Exam  Constitutional: He is oriented to person, place, and time. He appears well-developed and well-nourished.  HENT:  Head: Normocephalic and atraumatic.  Eyes: Pupils are equal, round, and reactive to light. EOM are normal.  Neck: Normal range of motion.  Cardiovascular: Normal rate.  Respiratory: Effort normal.  Musculoskeletal: Normal range of motion.  Neurological: He is alert and oriented to person, place, and time.  Skin: Skin is warm and dry.  Psychiatric: He has a normal mood and affect. His behavior is normal. Thought content normal.     Assessment/Plan  chronic pain syndrome, lumbar post-laminectomy syndrome Plan:  Permanent implantation [employer] spinal cord stimulator  Bonna Gains, MD 05/09/2018, 7:17 AM

## 2018-05-08 NOTE — Anesthesia Preprocedure Evaluation (Addendum)
Anesthesia Evaluation  Patient identified by MRN, date of birth, ID band Patient awake    Reviewed: Allergy & Precautions, NPO status , Patient's Chart, lab work & pertinent test results  History of Anesthesia Complications Negative for: history of anesthetic complications  Airway Mallampati: II  TM Distance: >3 FB Neck ROM: Full    Dental  (+) Teeth Intact, Dental Advisory Given   Pulmonary sleep apnea and Continuous Positive Airway Pressure Ventilation ,    Pulmonary exam normal breath sounds clear to auscultation       Cardiovascular negative cardio ROS Normal cardiovascular exam Rhythm:Regular Rate:Normal     Neuro/Psych Anxiety Lumbar stenosis    GI/Hepatic Neg liver ROS, GERD  Controlled,  Endo/Other  negative endocrine ROS  Renal/GU negative Renal ROS     Musculoskeletal negative musculoskeletal ROS (+)   Abdominal   Peds  Hematology negative hematology ROS (+)   Anesthesia Other Findings Day of surgery medications reviewed with the patient.  Reproductive/Obstetrics                            Anesthesia Physical Anesthesia Plan  ASA: II  Anesthesia Plan: General   Post-op Pain Management:    Induction: Intravenous  PONV Risk Score and Plan: 2 and Treatment may vary due to age or medical condition and Ondansetron  Airway Management Planned: Oral ETT  Additional Equipment:   Intra-op Plan:   Post-operative Plan: Extubation in OR  Informed Consent: I have reviewed the patients History and Physical, chart, labs and discussed the procedure including the risks, benefits and alternatives for the proposed anesthesia with the patient or authorized representative who has indicated his/her understanding and acceptance.     Dental advisory given  Plan Discussed with: CRNA  Anesthesia Plan Comments:       Anesthesia Quick Evaluation

## 2018-05-09 ENCOUNTER — Ambulatory Visit (HOSPITAL_COMMUNITY): Payer: Medicare Other

## 2018-05-09 ENCOUNTER — Encounter (HOSPITAL_COMMUNITY)
Admission: RE | Disposition: A | Discharge status: Home / Self Care | Payer: Self-pay | Source: Ambulatory Visit | Attending: Anesthesiology

## 2018-05-09 ENCOUNTER — Ambulatory Visit (HOSPITAL_COMMUNITY): Payer: Medicare Other | Admitting: Certified Registered Nurse Anesthetist

## 2018-05-09 ENCOUNTER — Ambulatory Visit (HOSPITAL_COMMUNITY)
Admission: RE | Admit: 2018-05-09 | Discharge: 2018-05-09 | Disposition: A | Discharge status: Home / Self Care | Payer: Medicare Other | Source: Ambulatory Visit | Attending: Anesthesiology | Admitting: Anesthesiology

## 2018-05-09 ENCOUNTER — Other Ambulatory Visit: Payer: Self-pay

## 2018-05-09 ENCOUNTER — Encounter (HOSPITAL_COMMUNITY): Payer: Self-pay | Admitting: *Deleted

## 2018-05-09 DIAGNOSIS — Z7982 Long term (current) use of aspirin: Secondary | ICD-10-CM | POA: Diagnosis not present

## 2018-05-09 DIAGNOSIS — Z882 Allergy status to sulfonamides status: Secondary | ICD-10-CM | POA: Insufficient documentation

## 2018-05-09 DIAGNOSIS — G894 Chronic pain syndrome: Secondary | ICD-10-CM | POA: Insufficient documentation

## 2018-05-09 DIAGNOSIS — Z981 Arthrodesis status: Secondary | ICD-10-CM | POA: Insufficient documentation

## 2018-05-09 DIAGNOSIS — Z79899 Other long term (current) drug therapy: Secondary | ICD-10-CM | POA: Insufficient documentation

## 2018-05-09 DIAGNOSIS — F419 Anxiety disorder, unspecified: Secondary | ICD-10-CM | POA: Diagnosis not present

## 2018-05-09 DIAGNOSIS — Z96649 Presence of unspecified artificial hip joint: Secondary | ICD-10-CM | POA: Diagnosis not present

## 2018-05-09 DIAGNOSIS — N400 Enlarged prostate without lower urinary tract symptoms: Secondary | ICD-10-CM | POA: Diagnosis not present

## 2018-05-09 DIAGNOSIS — E785 Hyperlipidemia, unspecified: Secondary | ICD-10-CM | POA: Diagnosis not present

## 2018-05-09 DIAGNOSIS — M961 Postlaminectomy syndrome, not elsewhere classified: Secondary | ICD-10-CM | POA: Insufficient documentation

## 2018-05-09 DIAGNOSIS — G4730 Sleep apnea, unspecified: Secondary | ICD-10-CM | POA: Diagnosis not present

## 2018-05-09 DIAGNOSIS — M48061 Spinal stenosis, lumbar region without neurogenic claudication: Secondary | ICD-10-CM | POA: Diagnosis not present

## 2018-05-09 DIAGNOSIS — M109 Gout, unspecified: Secondary | ICD-10-CM | POA: Insufficient documentation

## 2018-05-09 DIAGNOSIS — M5116 Intervertebral disc disorders with radiculopathy, lumbar region: Secondary | ICD-10-CM | POA: Diagnosis not present

## 2018-05-09 DIAGNOSIS — Z419 Encounter for procedure for purposes other than remedying health state, unspecified: Secondary | ICD-10-CM

## 2018-05-09 HISTORY — PX: SPINAL CORD STIMULATOR INSERTION: SHX5378

## 2018-05-09 HISTORY — DX: Hyperlipidemia, unspecified: E78.5

## 2018-05-09 HISTORY — DX: Anxiety disorder, unspecified: F41.9

## 2018-05-09 HISTORY — DX: Postlaminectomy syndrome, not elsewhere classified: M96.1

## 2018-05-09 HISTORY — DX: Presence of external hearing-aid: Z97.4

## 2018-05-09 HISTORY — DX: Sleep apnea, unspecified: G47.30

## 2018-05-09 HISTORY — DX: Malignant (primary) neoplasm, unspecified: C80.1

## 2018-05-09 LAB — CBC
HCT: 44.8 % (ref 39.0–52.0)
Hemoglobin: 15.4 g/dL (ref 13.0–17.0)
MCH: 31.8 pg (ref 26.0–34.0)
MCHC: 34.4 g/dL (ref 30.0–36.0)
MCV: 92.4 fL (ref 80.0–100.0)
Platelets: 199 10*3/uL (ref 150–400)
RBC: 4.85 MIL/uL (ref 4.22–5.81)
RDW: 13 % (ref 11.5–15.5)
WBC: 7.8 10*3/uL (ref 4.0–10.5)
nRBC: 0 % (ref 0.0–0.2)

## 2018-05-09 SURGERY — INSERTION, SPINAL CORD STIMULATOR, LUMBAR
Anesthesia: General

## 2018-05-09 MED ORDER — 0.9 % SODIUM CHLORIDE (POUR BTL) OPTIME
TOPICAL | Status: DC | PRN
  Administered 2018-05-09: 1000 mL

## 2018-05-09 MED ORDER — PROPOFOL 10 MG/ML IV BOLUS
INTRAVENOUS | Status: AC
  Filled 2018-05-09: qty 20

## 2018-05-09 MED ORDER — BACITRACIN-NEOMYCIN-POLYMYXIN 400-5-5000 EX OINT
TOPICAL_OINTMENT | CUTANEOUS | Status: AC
  Filled 2018-05-09: qty 1

## 2018-05-09 MED ORDER — BACITRACIN-NEOMYCIN-POLYMYXIN OINTMENT TUBE
TOPICAL_OINTMENT | CUTANEOUS | Status: DC | PRN
  Administered 2018-05-09: 1 via TOPICAL

## 2018-05-09 MED ORDER — PROMETHAZINE HCL 25 MG/ML IJ SOLN: 6.2500 mg | INTRAMUSCULAR | Status: DC | PRN

## 2018-05-09 MED ORDER — OXYCODONE HCL 5 MG/5ML PO SOLN: 5.0000 mg | Freq: Once | ORAL | Status: DC | PRN

## 2018-05-09 MED ORDER — FENTANYL CITRATE (PF) 250 MCG/5ML IJ SOLN
INTRAMUSCULAR | Status: DC | PRN
  Administered 2018-05-09: 100 ug via INTRAVENOUS
  Administered 2018-05-09: 50 ug via INTRAVENOUS

## 2018-05-09 MED ORDER — FENTANYL CITRATE (PF) 250 MCG/5ML IJ SOLN
INTRAMUSCULAR | Status: AC
  Filled 2018-05-09: qty 5

## 2018-05-09 MED ORDER — ROCURONIUM BROMIDE 10 MG/ML (PF) SYRINGE
PREFILLED_SYRINGE | INTRAVENOUS | Status: DC | PRN
  Administered 2018-05-09: 10 mg via INTRAVENOUS
  Administered 2018-05-09: 20 mg via INTRAVENOUS
  Administered 2018-05-09: 50 mg via INTRAVENOUS

## 2018-05-09 MED ORDER — ACETAMINOPHEN 10 MG/ML IV SOLN: 1000.0000 mg | Freq: Once | INTRAVENOUS | Status: DC | PRN

## 2018-05-09 MED ORDER — SODIUM CHLORIDE 0.9 % IV SOLN
INTRAVENOUS | Status: DC | PRN
  Administered 2018-05-09: 08:00:00

## 2018-05-09 MED ORDER — PROPOFOL 10 MG/ML IV BOLUS
INTRAVENOUS | Status: DC | PRN
  Administered 2018-05-09: 170 mg via INTRAVENOUS
  Administered 2018-05-09: 30 mg via INTRAVENOUS

## 2018-05-09 MED ORDER — ONDANSETRON HCL 4 MG/2ML IJ SOLN
INTRAMUSCULAR | Status: AC
  Filled 2018-05-09: qty 2

## 2018-05-09 MED ORDER — CEFAZOLIN SODIUM-DEXTROSE 2-4 GM/100ML-% IV SOLN
2.0000 g | INTRAVENOUS | Status: AC
  Administered 2018-05-09: 2 g via INTRAVENOUS
  Filled 2018-05-09: qty 100

## 2018-05-09 MED ORDER — CEPHALEXIN 500 MG PO CAPS: 500.0000 mg | ORAL_CAPSULE | Freq: Four times a day (QID) | ORAL | 0 refills | Status: DC

## 2018-05-09 MED ORDER — HYDROCODONE-ACETAMINOPHEN 7.5-325 MG PO TABS: 1.0000 | ORAL_TABLET | ORAL | 0 refills | Status: AC | PRN

## 2018-05-09 MED ORDER — FENTANYL CITRATE (PF) 100 MCG/2ML IJ SOLN
25.0000 ug | INTRAMUSCULAR | Status: DC | PRN
  Administered 2018-05-09 (×2): 50 ug via INTRAVENOUS

## 2018-05-09 MED ORDER — OXYCODONE HCL 5 MG PO TABS: 5.0000 mg | ORAL_TABLET | Freq: Once | ORAL | Status: DC | PRN

## 2018-05-09 MED ORDER — CHLORHEXIDINE GLUCONATE CLOTH 2 % EX PADS: 6.0000 | MEDICATED_PAD | Freq: Once | CUTANEOUS | Status: DC

## 2018-05-09 MED ORDER — ONDANSETRON HCL 4 MG/2ML IJ SOLN
INTRAMUSCULAR | Status: DC | PRN
  Administered 2018-05-09: 4 mg via INTRAVENOUS

## 2018-05-09 MED ORDER — FENTANYL CITRATE (PF) 100 MCG/2ML IJ SOLN
INTRAMUSCULAR | Status: AC
  Administered 2018-05-09: 50 ug via INTRAVENOUS
  Filled 2018-05-09: qty 2

## 2018-05-09 MED ORDER — LIDOCAINE 2% (20 MG/ML) 5 ML SYRINGE
INTRAMUSCULAR | Status: DC | PRN
  Administered 2018-05-09: 100 mg via INTRAVENOUS

## 2018-05-09 MED ORDER — LIDOCAINE-EPINEPHRINE 1 %-1:100000 IJ SOLN
INTRAMUSCULAR | Status: DC | PRN
  Administered 2018-05-09: 20 mL

## 2018-05-09 MED ORDER — SUGAMMADEX SODIUM 200 MG/2ML IV SOLN
INTRAVENOUS | Status: DC | PRN
  Administered 2018-05-09: 190 mg via INTRAVENOUS

## 2018-05-09 MED ORDER — LIDOCAINE 2% (20 MG/ML) 5 ML SYRINGE
INTRAMUSCULAR | Status: AC
  Filled 2018-05-09: qty 5

## 2018-05-09 MED ORDER — LIDOCAINE-EPINEPHRINE 1 %-1:100000 IJ SOLN
INTRAMUSCULAR | Status: AC
  Filled 2018-05-09: qty 2

## 2018-05-09 MED ORDER — LACTATED RINGERS IV SOLN
INTRAVENOUS | Status: DC | PRN
  Administered 2018-05-09 (×2): via INTRAVENOUS

## 2018-05-09 SURGICAL SUPPLY — 69 items
ADH SKN CLS APL DERMABOND .7 (GAUZE/BANDAGES/DRESSINGS) ×1
ANCH LD 4 SETX2 CLIK X (Stimulator) ×1 IMPLANT
ANCHOR CLIK X NEURO (Stimulator) ×1 IMPLANT
APL SKNCLS STERI-STRIP NONHPOA (GAUZE/BANDAGES/DRESSINGS)
BAG DECANTER FOR FLEXI CONT (MISCELLANEOUS) ×2 IMPLANT
BENZOIN TINCTURE PRP APPL 2/3 (GAUZE/BANDAGES/DRESSINGS) IMPLANT
BINDER ABD UNIV 10 28-50 (GAUZE/BANDAGES/DRESSINGS) IMPLANT
BINDER ABDOM UNIV 10 (GAUZE/BANDAGES/DRESSINGS) ×2
BINDER ABDOMINAL 12 ML 46-62 (SOFTGOODS) ×2 IMPLANT
BLADE CLIPPER SURG (BLADE) ×1 IMPLANT
CABLE OR STIMULATOR 2X8 61 (WIRE) ×1 IMPLANT
CHLORAPREP W/TINT 26ML (MISCELLANEOUS) ×2 IMPLANT
CLIP VESOCCLUDE SM WIDE 6/CT (CLIP) IMPLANT
COVER WAND RF STERILE (DRAPES) ×1 IMPLANT
DERMABOND ADVANCED (GAUZE/BANDAGES/DRESSINGS) ×1
DERMABOND ADVANCED .7 DNX12 (GAUZE/BANDAGES/DRESSINGS) ×1 IMPLANT
DRAPE C-ARM 42X72 X-RAY (DRAPES) ×2 IMPLANT
DRAPE C-ARMOR (DRAPES) ×2 IMPLANT
DRAPE LAPAROTOMY 100X72X124 (DRAPES) ×2 IMPLANT
DRAPE POUCH INSTRU U-SHP 10X18 (DRAPES) ×2 IMPLANT
DRAPE SURG 17X23 STRL (DRAPES) ×2 IMPLANT
DRSG OPSITE POSTOP 3X4 (GAUZE/BANDAGES/DRESSINGS) ×2 IMPLANT
ELECT REM PT RETURN 9FT ADLT (ELECTROSURGICAL) ×2
ELECTRODE REM PT RTRN 9FT ADLT (ELECTROSURGICAL) ×1 IMPLANT
GAUZE 4X4 16PLY RFD (DISPOSABLE) ×2 IMPLANT
GENERATOR NEUROSTIMULATOR (Neurostimulator) ×1 IMPLANT
GLOVE BIOGEL PI IND STRL 7.5 (GLOVE) ×1 IMPLANT
GLOVE BIOGEL PI INDICATOR 7.5 (GLOVE) ×1
GLOVE ECLIPSE 7.5 STRL STRAW (GLOVE) ×2 IMPLANT
GLOVE EXAM NITRILE LRG STRL (GLOVE) IMPLANT
GLOVE EXAM NITRILE XL STR (GLOVE) IMPLANT
GLOVE EXAM NITRILE XS STR PU (GLOVE) IMPLANT
GOWN STRL REUS W/ TWL LRG LVL3 (GOWN DISPOSABLE) IMPLANT
GOWN STRL REUS W/ TWL XL LVL3 (GOWN DISPOSABLE) IMPLANT
GOWN STRL REUS W/TWL 2XL LVL3 (GOWN DISPOSABLE) IMPLANT
GOWN STRL REUS W/TWL LRG LVL3 (GOWN DISPOSABLE)
GOWN STRL REUS W/TWL XL LVL3 (GOWN DISPOSABLE)
KIT BASIN OR (CUSTOM PROCEDURE TRAY) ×2 IMPLANT
KIT CHARGING (KITS) ×1
KIT CHARGING PRECISION NEURO (KITS) IMPLANT
KIT LEAD PERCUTANOUS INFINION (Lead) ×2 IMPLANT
KIT REMOTE CONTROL 112802 FREE (KITS) ×1 IMPLANT
KIT TURNOVER KIT B (KITS) ×2 IMPLANT
NDL 18GX1X1/2 (RX/OR ONLY) (NEEDLE) IMPLANT
NDL ENTRADA 4.5IN (NEEDLE) IMPLANT
NDL HYPO 25X1 1.5 SAFETY (NEEDLE) ×1 IMPLANT
NEEDLE 18GX1X1/2 (RX/OR ONLY) (NEEDLE) IMPLANT
NEEDLE ENTRADA 4.5IN (NEEDLE) ×4 IMPLANT
NEEDLE HYPO 25X1 1.5 SAFETY (NEEDLE) ×2 IMPLANT
NS IRRIG 1000ML POUR BTL (IV SOLUTION) ×2 IMPLANT
PACK LAMINECTOMY NEURO (CUSTOM PROCEDURE TRAY) ×2 IMPLANT
PAD ARMBOARD 7.5X6 YLW CONV (MISCELLANEOUS) ×2 IMPLANT
SPONGE LAP 4X18 RFD (DISPOSABLE) ×2 IMPLANT
SPONGE SURGIFOAM ABS GEL SZ50 (HEMOSTASIS) IMPLANT
STAPLER SKIN PROX WIDE 3.9 (STAPLE) ×2 IMPLANT
STRIP CLOSURE SKIN 1/2X4 (GAUZE/BANDAGES/DRESSINGS) IMPLANT
SUT MNCRL AB 4-0 PS2 18 (SUTURE) ×1 IMPLANT
SUT SILK 0 (SUTURE) ×2
SUT SILK 0 MO-6 18XCR BRD 8 (SUTURE) ×1 IMPLANT
SUT SILK 0 TIES 10X30 (SUTURE) IMPLANT
SUT SILK 2 0 TIES 10X30 (SUTURE) IMPLANT
SUT VIC AB 2-0 CP2 18 (SUTURE) ×5 IMPLANT
SYR 10ML LL (SYRINGE) ×1 IMPLANT
SYR EPIDURAL 5ML GLASS (SYRINGE) ×2 IMPLANT
TOOL LONG TUNNEL (SPINAL CORD STIMULATOR) ×1 IMPLANT
TOWEL GREEN STERILE (TOWEL DISPOSABLE) ×2 IMPLANT
TOWEL GREEN STERILE FF (TOWEL DISPOSABLE) ×2 IMPLANT
WATER STERILE IRR 1000ML POUR (IV SOLUTION) ×2 IMPLANT
YANKAUER SUCT BULB TIP NO VENT (SUCTIONS) ×2 IMPLANT

## 2018-05-09 NOTE — Discharge Instructions (Signed)
Dr. Maryjean Ka Post-Op Orders   Ice Pack - 20 minutes on (in a pillow case), and 20 minutes off. Wear the ice pack UNDER the binder.  Follow up in office, they will call you for an appointment in 10 days to 2 weeks.  Lie flat as much as possible for the next 24 hours or so, then Increase activity gradually.    No lifting anything heavier than a gallon of milk (10 pounds) until seen in the office.  Advance diet slowly as tolerated.  Dressing care:  Keep dressing dry for 3 days, and on Post-op day 4, may shower.  Call for fever, drainage, and redness.  No swimming or bathing in a bathtub (do not get into standing water).

## 2018-05-09 NOTE — Op Note (Signed)
PREOP DX: 1) chronic pain syndrome 2) DDD, foraminal stenosis 3) chronic radiculopathy POSTOP MO:LMBE as preop PROCEDURES PERFORMED:1) intraop fluoro 2) placement of 2 16 contact [employer] Infinion leads 3) placement of Spectra SCS generator 4) post op complex SCS programming SURGEON:Herman Mell  ASSISTANT: NONE  ANESTHESIA:GETA EBL: <20cc  DESCRIPTION OF PROCEDURE: After a discussion of risks, benefits and alternatives, informed consent was obtained. The patient was taken to the OR,general anesthesia induced,turned prone onto a Jackson table, all pressure points padded, SCD's placed. A timeout was taken to verify the correct patient, position, personnel, availability of appropriate equipment, and administration of perioperative antibiotics.  The thoracic and lumbar areas were widely prepped with chloraprep and draped into a sterile field. Fluoroscopy was used to plan aleftparamedian incision at the T12-L1levels and an incision made with a 10 blade and carried down to the dorsolumbar fascia with the bovie and blunt dissection. Retractors were placed and a 14g [employer] tuohy needle placed into the epidural space at the T11-12interspace using biplanar fluoro and loss-of-resistance technique. The needle was aspirated without any return of fluid. A [employer] INFINION lead was introduced and under live AP fluoro advanced until the distal-most2contactsoverlay thesuperioraspectof theT8 vertebral body shadow with the rest of the contacts distributed over [employer] bodies in a position justleftof anatomic midline. Placement of a second leadjust at anatomic midline at the same levelswas accomplishedusing the same technique. 0 silk sutures were placed in the fascia adjacent to the needles. The needles and stylets were removed under fluoroscopy with no lead migration noted. Leads were then fixed to the fascia byClik anchorswith the sutures; repeat images  were obtained to verify that there had been no lead migration. The incision was inspected and hemostasis obtained with the bipolar cautery.    Attention was then turned to creation of a subcutaneous pocket. At theleft flank, a 3 cm incision was made with a 10 blade and using the bovie and blunt dissection a pocket of size appropriate to place a SCS generator. The pocket was trialed, and found to be of adequate size. The pocket was inspected for hemostasis, which was found to be excellent. Using reverse seldinger technique, the leads were tunneled to the pocket site, and the leads inserted into the SCS generator. Impedances were checked, and all found to be excellent. The leads were then all fixed into position with a self-torquing wrench. The wiring was all carefully coiled, placed behind the generator and placed in the pocket.  Allincisions were copiously irrigated with bacitracin-containing irrigation. The lumbar incisions wereclosed in2layers of interrupted 2-0 vicryl and the skin closedwith staples.The pocket incision was closed with a deeper layer of 2-0 vicryl interrupted sutures, and the skin closed with staples. Bacitracin ointment and sterile dressings were applied.   Needle, sponge, and instrument counts were correct x2 at the end of the case.    The patient was then carefully awakened from anesthesia, turned supine, an abdominal binder placed, and the patient taken to the recovery room whereshe underwent complex spinal cord stimulator programming.  COMPLICATIONS: NONE  CONDITION: Stable throughout the course of the procedure and immediately afterward  DISPOSITION:anticipatedischarge to home, with antibiotics and pain medicine. Discussed care with the patientandfamily. Followup in clinic will be scheduled in 10-14 days.

## 2018-05-09 NOTE — Anesthesia Postprocedure Evaluation (Signed)
Anesthesia Post Note  Patient: Jose Farrell  Procedure(s) Performed: LUMBAR SPINAL CORD STIMULATOR INSERTION (N/A )     Patient location during evaluation: PACU Anesthesia Type: General Level of consciousness: awake and alert Pain management: pain level controlled Vital Signs Assessment: post-procedure vital signs reviewed and stable Respiratory status: spontaneous breathing, nonlabored ventilation and respiratory function stable Cardiovascular status: blood pressure returned to baseline and stable Postop Assessment: no apparent nausea or vomiting Anesthetic complications: no    Last Vitals:  Vitals:   05/09/18 0925 05/09/18 0940  BP: (!) 151/83 135/62  Pulse: 88 75  Resp: 14 13  Temp: 36.7 C   SpO2: 95% 96%    Last Pain:  Vitals:   05/09/18 0940  TempSrc:   PainSc: [name]

## 2018-05-09 NOTE — Transfer of Care (Signed)
Immediate Anesthesia Transfer of Care Note  Patient: Jose Farrell  Procedure(s) Performed: LUMBAR SPINAL CORD STIMULATOR INSERTION (N/A )  Patient Location: PACU  Anesthesia Type:General  Level of Consciousness: drowsy and patient cooperative  Airway & Oxygen Therapy: Patient Spontanous Breathing and Patient connected to face mask oxygen  Post-op Assessment: Report given to RN and Post -op Vital signs reviewed and stable  Post vital signs: Reviewed and stable  Last Vitals:  Vitals Value Taken Time  BP 151/83 05/09/2018  9:25 AM  Temp    Pulse 87 05/09/2018  9:25 AM  Resp 14 05/09/2018  9:25 AM  SpO2 99 % 05/09/2018  9:25 AM  Vitals shown include unvalidated device data.  Last Pain:  Vitals:   05/09/18 0622  TempSrc:   PainSc: 0-No pain      Patients Stated Pain Goal: 5 (46/65/99 3570)  Complications: No apparent anesthesia complications

## 2018-05-09 NOTE — Anesthesia Procedure Notes (Signed)
Procedure Name: Intubation Date/Time: 05/09/2018 7:45 AM Performed by: Bryson Corona, CRNA Pre-anesthesia Checklist: Patient identified, Emergency Drugs available, Suction available and Patient being monitored Patient Re-evaluated:Patient Re-evaluated prior to induction Oxygen Delivery Method: Circle System Utilized Preoxygenation: Pre-oxygenation with 100% oxygen Induction Type: IV induction Ventilation: Oral airway inserted - appropriate to patient size Laryngoscope Size: Mac and 4 Grade View: Grade I Tube type: Oral Tube size: 7.0 mm Number of attempts: 1 Airway Equipment and Method: Stylet and Oral airway Placement Confirmation: ETT inserted through vocal cords under direct vision,  positive ETCO2 and breath sounds checked- equal and bilateral Secured at: 24 cm Tube secured with: Tape Dental Injury: Teeth and Oropharynx as per pre-operative assessment

## 2018-05-11 ENCOUNTER — Encounter (HOSPITAL_COMMUNITY): Payer: Self-pay | Admitting: Anesthesiology

## 2018-09-25 ENCOUNTER — Other Ambulatory Visit (HOSPITAL_COMMUNITY): Payer: Self-pay | Admitting: *Deleted

## 2018-09-26 ENCOUNTER — Encounter (HOSPITAL_COMMUNITY): Payer: Self-pay | Admitting: Pharmacy Technician

## 2018-09-26 ENCOUNTER — Emergency Department (HOSPITAL_COMMUNITY)
Admission: EM | Admit: 2018-09-26 | Discharge: 2018-09-26 | Disposition: A | Discharge status: Home / Self Care | Payer: Medicare Other | Attending: Emergency Medicine | Admitting: Emergency Medicine

## 2018-09-26 ENCOUNTER — Other Ambulatory Visit: Payer: Self-pay

## 2018-09-26 ENCOUNTER — Encounter (HOSPITAL_COMMUNITY)
Admission: RE | Admit: 2018-09-26 | Discharge: 2018-09-26 | Disposition: A | Discharge status: Home / Self Care | Payer: Medicare Other | Source: Ambulatory Visit | Attending: Internal Medicine | Admitting: Internal Medicine

## 2018-09-26 DIAGNOSIS — Z79899 Other long term (current) drug therapy: Secondary | ICD-10-CM | POA: Diagnosis not present

## 2018-09-26 DIAGNOSIS — R197 Diarrhea, unspecified: Secondary | ICD-10-CM | POA: Diagnosis not present

## 2018-09-26 DIAGNOSIS — Z7982 Long term (current) use of aspirin: Secondary | ICD-10-CM | POA: Diagnosis not present

## 2018-09-26 DIAGNOSIS — R109 Unspecified abdominal pain: Secondary | ICD-10-CM | POA: Diagnosis present

## 2018-09-26 DIAGNOSIS — R112 Nausea with vomiting, unspecified: Secondary | ICD-10-CM | POA: Diagnosis not present

## 2018-09-26 LAB — CBC WITH DIFFERENTIAL/PLATELET
Abs Immature Granulocytes: 0.04 10*3/uL (ref 0.00–0.07)
Basophils Absolute: 0 10*3/uL (ref 0.0–0.1)
Basophils Relative: 0 %
Eosinophils Absolute: 0.1 10*3/uL (ref 0.0–0.5)
Eosinophils Relative: 1 %
HCT: 51.5 % (ref 39.0–52.0)
Hemoglobin: 17.4 g/dL — ABNORMAL HIGH (ref 13.0–17.0)
Immature Granulocytes: 0 %
Lymphocytes Relative: 5 %
Lymphs Abs: 0.5 10*3/uL — ABNORMAL LOW (ref 0.7–4.0)
MCH: 32.1 pg (ref 26.0–34.0)
MCHC: 33.8 g/dL (ref 30.0–36.0)
MCV: 95 fL (ref 80.0–100.0)
Monocytes Absolute: 0.9 10*3/uL (ref 0.1–1.0)
Monocytes Relative: 8 %
Neutro Abs: 9.4 10*3/uL — ABNORMAL HIGH (ref 1.7–7.7)
Neutrophils Relative %: 86 %
Platelets: 186 10*3/uL (ref 150–400)
RBC: 5.42 MIL/uL (ref 4.22–5.81)
RDW: 12.9 % (ref 11.5–15.5)
WBC: 11 10*3/uL — ABNORMAL HIGH (ref 4.0–10.5)
nRBC: 0 % (ref 0.0–0.2)

## 2018-09-26 LAB — COMPREHENSIVE METABOLIC PANEL
ALT: 47 U/L — ABNORMAL HIGH (ref 0–44)
AST: 39 U/L (ref 15–41)
Albumin: 4.1 g/dL (ref 3.5–5.0)
Alkaline Phosphatase: 53 U/L (ref 38–126)
Anion gap: 12 (ref 5–15)
BUN: 17 mg/dL (ref 8–23)
CO2: 20 mmol/L — ABNORMAL LOW (ref 22–32)
Calcium: 9.2 mg/dL (ref 8.9–10.3)
Chloride: 106 mmol/L (ref 98–111)
Creatinine, Ser: 1.09 mg/dL (ref 0.61–1.24)
GFR calc Af Amer: >60 mL/min (ref >60)
GFR calc non Af Amer: >60 mL/min (ref >60)
Glucose, Bld: 144 mg/dL — ABNORMAL HIGH (ref 70–99)
Potassium: 4.6 mmol/L (ref 3.5–5.1)
Sodium: 138 mmol/L (ref 135–145)
Total Bilirubin: 0.6 mg/dL (ref 0.3–1.2)
Total Protein: 7.1 g/dL (ref 6.5–8.1)

## 2018-09-26 LAB — LIPASE, BLOOD: Lipase: 31 U/L (ref 11–51)

## 2018-09-26 LAB — URINALYSIS, ROUTINE W REFLEX MICROSCOPIC
Bilirubin Urine: NEGATIVE
Glucose, UA: NEGATIVE mg/dL
Hgb urine dipstick: NEGATIVE
Ketones, ur: NEGATIVE mg/dL
Leukocytes,Ua: NEGATIVE
Nitrite: NEGATIVE
Protein, ur: NEGATIVE mg/dL
Specific Gravity, Urine: 1.015 (ref 1.005–1.030)
pH: 5 (ref 5.0–8.0)

## 2018-09-26 MED ORDER — SODIUM CHLORIDE 0.9 % IV BOLUS
1000.0000 mL | Freq: Once | INTRAVENOUS | Status: AC
  Administered 2018-09-26: 1000 mL via INTRAVENOUS

## 2018-09-26 MED ORDER — ZOLEDRONIC ACID 5 MG/100ML IV SOLN
5.0000 mg | Freq: Once | INTRAVENOUS | Status: AC
  Administered 2018-09-26: 5 mg via INTRAVENOUS

## 2018-09-26 MED ORDER — ONDANSETRON HCL 4 MG/2ML IJ SOLN
4.0000 mg | Freq: Once | INTRAMUSCULAR | Status: AC
  Administered 2018-09-26: 13:00:00 4 mg via INTRAVENOUS
  Filled 2018-09-26: qty 2

## 2018-09-26 MED ORDER — ONDANSETRON HCL 4 MG PO TABS: 4.0000 mg | ORAL_TABLET | Freq: Four times a day (QID) | ORAL | 0 refills | Status: AC

## 2018-09-26 MED ORDER — ZOLEDRONIC ACID 5 MG/100ML IV SOLN
INTRAVENOUS | Status: AC
  Filled 2018-09-26: qty 100

## 2018-09-26 NOTE — ED Provider Notes (Signed)
[facility] EMERGENCY DEPARTMENT Provider Note   CSN: [id number] Arrival date & time: 09/26/18  1223     History   Chief Complaint Chief Complaint  Patient presents with  . Abdominal Pain    HPI Neziah Braley Labus is a 83 y.o. male.      Burle L. Brickell is an 83 year old male who presents to the ED with nausea, vomiting, and diarrhea. Mr. Lafauci states that he had diarrhea this morning when he woke up. He states that the stools looked "lumpy" with no blood. He states that he had popcorn shrimp last night at 5:00 p.m. from a restaurant, and thought that may have been the cause for his diarrhea. He had an appointment today at 11:00 a.m. for a DEXA scan, and during the infusion he began to feel nauseous and began to vomit. Mr. Retter says that the nausea and vomiting are not related to the infusion as he tolerated his infusion last year "fine." He does complain of abdominal pain in the center of his abdomen that rates 3/10 on the pain scale with no radiation.  The history is provided by the patient.    Past Medical History:  Diagnosis Date  . Anxiety   . BPH (benign prostatic hyperplasia)   . Cancer ([place])    skin  . ED (erectile dysfunction)   . GERD (gastroesophageal reflux disease)   . Gout   . Hemorrhoids   . Hyperlipidemia   . Insomnia   . Lower back pain   . Lumbar stenosis   . Positional vertigo   . Post laminectomy syndrome   . Right knee pain   . S/P left knee arthroscopy   . Sleep apnea    wears CPAP  . Wears hearing aid in both ears     There are no active problems to display for this patient.   Past Surgical History:  Procedure Laterality Date  . CATARACT EXTRACTION W/ INTRAOCULAR LENS  IMPLANT, BILATERAL    . COLONOSCOPY    . KNEE SURGERY    . LUMBAR SPINE SURGERY  4/10, 6/10  . MOHS SURGERY    . SPINAL CORD STIMULATOR INSERTION N/A 05/09/2018   Procedure: LUMBAR SPINAL CORD STIMULATOR INSERTION;  Surgeon: Clydell Hakim, MD;   Location: [facility];  Service: Neurosurgery;  Laterality: N/A;  LUMBAR SPINAL CORD STIMULATOR INSERTION  . TOTAL HIP ARTHROPLASTY    . WISDOM TOOTH EXTRACTION          Home Medications    Prior to Admission medications   Medication Sig Start Date End Date Taking? Authorizing Provider  allopurinol (ZYLOPRIM) 300 MG tablet Take 300 mg by mouth daily.    [provider]  aspirin 81 MG tablet Take 81 mg by mouth daily.    [provider]  cephALEXin (KEFLEX) 500 MG capsule Take 1 capsule (500 mg total) by mouth 4 (four) times daily. 05/09/18   Clydell Hakim, MD  FLUoxetine (PROZAC) 20 MG capsule Take 20 mg by mouth daily.    [provider]  ibuprofen (ADVIL,MOTRIN) 200 MG tablet Take 400 mg by mouth daily as needed for moderate pain.    [provider]  Omega-3 Fatty Acids (FISH OIL) 1000 MG CAPS Take 1 capsule by mouth daily.     [provider]  pravastatin (PRAVACHOL) 20 MG tablet Take 20 mg by mouth daily.    [provider]  tamsulosin (FLOMAX) 0.4 MG CAPS capsule Take 0.4 mg by mouth.  [provider]  traMADol (ULTRAM) 50 MG tablet Take 50 mg by mouth 2 (two) times daily as needed for moderate pain.  10/12/15   [provider]  Vitamin D, Ergocalciferol, (DRISDOL) 50000 UNITS CAPS capsule Take 50,000 Units by mouth 2 (two) times a week.     [provider]    Family History Family History  Problem Relation Age of Onset  . Kidney disease Father   . Diabetes Mother   . Hip fracture Mother     Social History Social History   Tobacco Use  . Smoking status: Never Smoker  . Smokeless tobacco: Never Used  Substance Use Topics  . Alcohol use: Yes    Alcohol/week: 0.0 standard drinks    Comment: occasional beer  . Drug use: No     Allergies   Sulfa antibiotics   Review of Systems Review of Systems  Constitutional: Negative for chills, fatigue and fever.  HENT: Negative for ear pain and sore  throat.   Eyes: Negative for pain and visual disturbance.  Respiratory: Negative for cough, shortness of breath, wheezing and stridor.   Cardiovascular: Negative for chest pain, palpitations and leg swelling.  Gastrointestinal: Positive for diarrhea, nausea and vomiting. Negative for abdominal pain.  Genitourinary: Negative for dysuria and hematuria.  Musculoskeletal: Negative for arthralgias and back pain.  Skin: Negative for color change and rash.  Neurological: Negative for seizures and syncope.  All other systems reviewed and are negative.    Physical Exam Updated Vital Signs BP (!) 154/79   Pulse 80   Temp 98.1 F (36.7 C) (Oral)   Resp 18   Ht 5\' 8"  (1.727 m)   Wt 93 kg   SpO2 96%   BMI 31.17 kg/m   Physical Exam Vitals signs and nursing note reviewed.  Constitutional:      Appearance: He is well-developed.  HENT:     Head: Normocephalic and atraumatic.  Eyes:     Conjunctiva/sclera: Conjunctivae normal.  Neck:     Musculoskeletal: Neck supple.  Cardiovascular:     Rate and Rhythm: Normal rate and regular rhythm.     Heart sounds: No murmur.  Pulmonary:     Effort: Pulmonary effort is normal. No respiratory distress.     Breath sounds: Normal breath sounds.  Abdominal:     General: Bowel sounds are normal. There is no abdominal bruit. There are no signs of injury.     Palpations: Abdomen is soft. There is no shifting dullness.     Tenderness: There is generalized abdominal tenderness. There is no guarding or rebound. Negative signs include Murphy's sign and Rovsing's sign.  Skin:    General: Skin is warm and dry.  Neurological:     Mental Status: He is alert.      ED Treatments / Results  Labs (all labs ordered are listed, but only abnormal results are displayed) Labs Reviewed  URINALYSIS, ROUTINE W REFLEX MICROSCOPIC  CBC WITH DIFFERENTIAL/PLATELET  COMPREHENSIVE METABOLIC PANEL    EKG None  Radiology No results found.  Procedures  Procedures (including critical care time)  Medications Ordered in ED Medications  sodium chloride 0.9 % bolus 1,000 mL (has no administration in time range)  ondansetron (ZOFRAN) injection 4 mg (has no administration in time range)     Initial Impression / Assessment and Plan / ED Course  I have reviewed the triage vital signs and the nursing notes.  Pertinent labs & imaging results that were available during my care  of the patient were reviewed by me and considered in my medical decision making (see chart for details).   Assessment:  Carvel L. Komperda is an 83 y/o patient that presents to the ED with nausea, vomiting, diarrhea, and abdominal discomfort.  Plan:  - CBC with differential  - CMP  - Lipase - NaCl 0.9% bolus 1,000 mL  - Urinalysis, Routine w reflex microscopic - Continuation of care is still ongoing, signing out to Dr. Lennice Sites.        Final Clinical Impressions(s) / ED Diagnoses   Final diagnoses:  None    ED Discharge Orders    None       Maudie Mercury, MD 09/26/18 1529    Tegeler, Gwenyth Allegra, MD 09/26/18 [phone]

## 2018-09-26 NOTE — ED Triage Notes (Signed)
Pt arrives via pov with reports of epigastric pain, emesis, diarrhea onset today.

## 2018-09-26 NOTE — Discharge Instructions (Signed)

## 2018-09-26 NOTE — ED Notes (Signed)
Pt walked to bathroom, no complaints at this time

## 2018-09-26 NOTE — ED Provider Notes (Signed)
Patient here with nausea, vomiting diarrhea.  Lab work unremarkable.  Awaiting urinalysis.  Patient has had some GI symptoms.  Ate some shellfish last night which he is allergic to.  Likely symptoms secondary to that.  Felt better after IV fluids.  Urinalysis negative.  Given prescription for Zofran.  No abdominal tenderness on my exam.  Discharged in good condition.  This chart was dictated using voice recognition software.  Despite best efforts to proofread,  errors can occur which can change the documentation meaning.     Lennice Sites, DO 09/26/18 1621

## 2019-03-09 ENCOUNTER — Encounter: Payer: Self-pay | Admitting: Adult Health

## 2019-03-09 ENCOUNTER — Telehealth: Payer: Self-pay

## 2019-03-09 ENCOUNTER — Ambulatory Visit: Payer: Medicare Other | Admitting: Adult Health

## 2019-03-09 NOTE — Telephone Encounter (Signed)
Patient was a no call/no show for their appointment today.   

## 2019-04-09 ENCOUNTER — Telehealth: Payer: Self-pay | Admitting: Unknown Physician Specialty

## 2019-04-09 NOTE — Telephone Encounter (Signed)
Called to discuss with patient about Covid symptoms and the use of bamlanivimab, a monoclonal antibody infusion for those with mild to moderate Covid symptoms and at a high risk of hospitalization.  Pt is qualified for this infusion at the [facility] infusion center due to Age > 65   Message left to call back  

## 2019-04-13 ENCOUNTER — Other Ambulatory Visit: Payer: Self-pay | Admitting: Unknown Physician Specialty

## 2019-04-13 ENCOUNTER — Telehealth: Payer: Self-pay | Admitting: Unknown Physician Specialty

## 2019-04-13 ENCOUNTER — Ambulatory Visit (HOSPITAL_COMMUNITY)
Admission: RE | Admit: 2019-04-13 | Discharge: 2019-04-13 | Disposition: A | Discharge status: Home / Self Care | Payer: Medicare Other | Source: Ambulatory Visit | Attending: Pulmonary Disease | Admitting: Pulmonary Disease

## 2019-04-13 DIAGNOSIS — I10 Essential (primary) hypertension: Secondary | ICD-10-CM | POA: Diagnosis present

## 2019-04-13 DIAGNOSIS — U071 COVID-19: Secondary | ICD-10-CM | POA: Insufficient documentation

## 2019-04-13 DIAGNOSIS — Z23 Encounter for immunization: Secondary | ICD-10-CM | POA: Insufficient documentation

## 2019-04-13 MED ORDER — ALBUTEROL SULFATE HFA 108 (90 BASE) MCG/ACT IN AERS: 2.0000 | INHALATION_SPRAY | Freq: Once | RESPIRATORY_TRACT | Status: DC | PRN

## 2019-04-13 MED ORDER — SODIUM CHLORIDE 0.9 % IV SOLN
INTRAVENOUS | Status: DC | PRN
  Administered 2019-04-13: 250 mL via INTRAVENOUS

## 2019-04-13 MED ORDER — EPINEPHRINE 0.3 MG/0.3ML IJ SOAJ: 0.3000 mg | Freq: Once | INTRAMUSCULAR | Status: DC | PRN

## 2019-04-13 MED ORDER — FAMOTIDINE IN NACL 20-0.9 MG/50ML-% IV SOLN: 20.0000 mg | Freq: Once | INTRAVENOUS | Status: DC | PRN

## 2019-04-13 MED ORDER — METHYLPREDNISOLONE SODIUM SUCC 125 MG IJ SOLR: 125.0000 mg | Freq: Once | INTRAMUSCULAR | Status: DC | PRN

## 2019-04-13 MED ORDER — SODIUM CHLORIDE 0.9 % IV SOLN
Freq: Once | INTRAVENOUS | Status: AC
  Filled 2019-04-13: qty 10

## 2019-04-13 MED ORDER — DIPHENHYDRAMINE HCL 50 MG/ML IJ SOLN: 50.0000 mg | Freq: Once | INTRAMUSCULAR | Status: DC | PRN

## 2019-04-13 NOTE — Telephone Encounter (Signed)
Discussed with patient about Covid symptoms and the use of bamlanivimab, a monoclonal antibody infusion for those with mild to moderate Covid symptoms and at a high risk of hospitalization.  Pt is qualified for this infusion at the [facility] infusion center due to Age > 65  However on day 8.  Will put on the waiting list  Sx onset 1/17

## 2019-04-13 NOTE — Discharge Instructions (Signed)

## 2019-04-13 NOTE — Progress Notes (Signed)
  Diagnosis: COVID-19  Physician: Dr. Patrick Wright  Procedure: Covid Infusion Clinic Med: casirivimab\imdevimab infusion - Provided patient with casirivimab\imdevimab fact sheet for patients, parents and caregivers prior to infusion.  Complications: No immediate complications noted.  Discharge: Discharged home   Ally Yow 04/13/2019   

## 2019-04-13 NOTE — Progress Notes (Signed)
  I connected by phone with Jose Farrell on 04/13/2019 at 11:54 AM to discuss the potential use of an new treatment for mild to moderate COVID-19 viral infection in non-hospitalized patients.  This patient is a 84 y.o. male that meets the FDA criteria for Emergency Use Authorization of bamlanivimab or casirivimab\imdevimab.  Has a (+) direct SARS-CoV-2 viral test result  Has mild or moderate COVID-19   Is ? 82 years of age and weighs ? 40 kg  Is NOT hospitalized due to COVID-19  Is NOT requiring oxygen therapy or requiring an increase in baseline oxygen flow rate due to COVID-19  Is within 10 days of symptom onset  Has at least one of the high risk factor(s) for progression to severe COVID-19 and/or hospitalization as defined in EUA.  Specific high risk criteria : >/= 82 yo   I have spoken and communicated the following to the patient or parent/caregiver:  1. FDA has authorized the emergency use of bamlanivimab and casirivimab\imdevimab for the treatment of mild to moderate COVID-19 in adults and pediatric patients with positive results of direct SARS-CoV-2 viral testing who are 32 years of age and older weighing at least 40 kg, and who are at high risk for progressing to severe COVID-19 and/or hospitalization.  2. The significant known and potential risks and benefits of bamlanivimab and casirivimab\imdevimab, and the extent to which such potential risks and benefits are unknown.  3. Information on available alternative treatments and the risks and benefits of those alternatives, including clinical trials.  4. Patients treated with bamlanivimab and casirivimab\imdevimab should continue to self-isolate and use infection control measures (e.g., wear mask, isolate, social distance, avoid sharing personal items, clean and disinfect "high touch" surfaces, and frequent handwashing) according to CDC guidelines.   5. The patient or parent/caregiver has the option to accept or refuse  bamlanivimab or casirivimab\imdevimab .  After reviewing this information with the patient, The patient agreed to proceed with receiving the casirivimab\imdevimab infusion and will be provided a copy of the Fact sheet prior to receiving the infusion.[name] 04/13/2019 11:54 AM

## 2019-04-30 ENCOUNTER — Ambulatory Visit: Payer: Medicare Other

## 2019-05-15 ENCOUNTER — Ambulatory Visit: Payer: Medicare Other | Attending: Internal Medicine

## 2019-05-15 DIAGNOSIS — Z23 Encounter for immunization: Secondary | ICD-10-CM | POA: Insufficient documentation

## 2019-05-15 NOTE — Progress Notes (Signed)
   Covid-19 Vaccination Clinic  Name:  Jose Farrell    MRN: [id number] DOB: 1936-02-08  05/15/2019  Jose Farrell was observed post Covid-19 immunization for 15 minutes without incidence. He was provided with Vaccine Information Sheet and instruction to access the V-Safe system.   Jose Farrell was instructed to call 911 with any severe reactions post vaccine: Marland Kitchen Difficulty breathing  . Swelling of your face and throat  . A fast heartbeat  . A bad rash all over your body  . Dizziness and weakness    Immunizations Administered    Name Date Dose VIS Date Route   Pfizer COVID-19 Vaccine 05/15/2019 10:37 AM 0.3 mL 02/27/2019 Intramuscular   Manufacturer: [place]   Lot: [id number]   [place]: [id number]

## 2019-05-17 ENCOUNTER — Encounter: Payer: Self-pay | Admitting: Adult Health

## 2019-05-19 ENCOUNTER — Ambulatory Visit (INDEPENDENT_AMBULATORY_CARE_PROVIDER_SITE_OTHER): Payer: Medicare Other | Admitting: Adult Health

## 2019-05-19 ENCOUNTER — Encounter: Payer: Self-pay | Admitting: Adult Health

## 2019-05-19 ENCOUNTER — Telehealth: Payer: Self-pay

## 2019-05-19 ENCOUNTER — Other Ambulatory Visit: Payer: Self-pay

## 2019-05-19 VITALS — BP 124/68 | HR 58 | Temp 97.3°F | Ht 68.0 in | Wt 213.0 lb

## 2019-05-19 DIAGNOSIS — Z9989 Dependence on other enabling machines and devices: Secondary | ICD-10-CM

## 2019-05-19 DIAGNOSIS — G4752 REM sleep behavior disorder: Secondary | ICD-10-CM

## 2019-05-19 DIAGNOSIS — G4733 Obstructive sleep apnea (adult) (pediatric): Secondary | ICD-10-CM | POA: Diagnosis not present

## 2019-05-19 NOTE — Telephone Encounter (Signed)
[facility] orders for CPAP supplies per NP MM request. Awaiting confirmation.   "New machine: settings 6 cmH20 EPR 3  New supplies  Curious if so clean is covered by insurance"- NP Ward Givens

## 2019-05-19 NOTE — Patient Instructions (Signed)
Your Plan:  Continue using CPAP nightly and greater than 4 hours each night Consider Melatonin 1-3 mg 2 hours before bedtime If your symptoms worsen or you develop new symptoms please let us know.   Thank you for coming to see [place] at [facility]. I hope we have been able to provide you high quality care today.  You may receive a patient satisfaction survey over the next few weeks. We would appreciate your feedback and comments so that we may continue to improve ourselves and the health of our patients.

## 2019-05-19 NOTE — Telephone Encounter (Signed)
Loni Beckwith, Merrilee Seashore, Colletta [place], [place]; Jose Farrell, the patient is not eligible for insurance to cover a replacement CPAP until after 07/27/2019. I have pulled all the paperwork and have put him on my calendar to process this when he is eligible.  Can you please advise the patient?  Thank you.  Sonia Baller "  Received via [employer].

## 2019-05-19 NOTE — Telephone Encounter (Signed)
Thank you :)

## 2019-05-19 NOTE — Telephone Encounter (Signed)
Please review...  Received confirmation via community message.  And message back.  "Louie Casa, [place]; Greer Pickerel, [place], I will get the referral pulled.  So Clean equipment is not covered by insurance and typically cost around $300.  Sonia Baller  "

## 2019-05-19 NOTE — Progress Notes (Signed)
PATIENT: Jose Farrell DOB: 1935-06-08  REASON FOR VISIT: follow up HISTORY FROM: patient  HISTORY OF PRESENT ILLNESS: Today 05/19/19:  Jose Farrell is an 84 year old male with a history of obstructive sleep apnea on CPAP.  His download indicates that he uses machine 28 out of 30 days for compliance of 93%.  He uses machine greater than 4 hours 27 days for compliance of 90%.  On average he uses his machine 8 hours and 55 minutes.  His residual AHI is 1.6 on 6 cm of water with EPR 3.  He reports that the CPAP is working well for him.  He would like a new machine and supplies.  He states that there are some nights he does act out his dreams.  He has not fallen out of bed recently.  He also states that there are times he has trouble falling asleep.  In the past he has tried Klonopin and trazodone.  He returns today for an evaluation.  HISTORY 03/03/2018: I reviewed his CPAP compliance data from 01/28/2018 through 02/26/2018 which is a total of 30 days, during which time he used his CPAP every night with percent used days greater than 4 hours at 100%, indicating superb compliance with an average usage of 8 hours and 50 minutes, residual AHI at goal at 0.4 per hour, leak on the low side with the 95th percentile at 1.8 L/m on a pressure of 6 cm with EPR of 3. He reports he fell outside yesterday. He needs new supplies. He has not had a new headgear in over 6 months. He hit his head against a plastic horse watering trough. He did not actually lose consciousness but the wind did get knocked out of him and he felt nauseated for a little bit. This was right after lunch and he had a heavy meal. He did not throw up, has not had any sustained headache, does have soreness in the left forehead/temporal area and sustained a big bruise around the left eye. He does not have any double vision and denies any eye pain. He believes he is up-to-date with his tetanus. He does take a baby aspirin. His wife heard him fall  but did not actually see it. He has not had any worsening of his dream enactment behavior. He still has intermittent vivid dreams and some dream enactment, thankfully has not fallen out of bed and is compliant with his CPAP, is a little behind with his supplies he admits. He has had some nasal congestion recurrent. He denies any discolored nasal discharge. He tries to change his filter every 2 months but admits that he would need new supplies through his DME company, [facility].   REVIEW OF SYSTEMS: Out of a complete 14 system review of symptoms, the patient complains only of the following symptoms, and all other reviewed systems are negative.  Epworth sleepiness score 5 fatigue severity score 32  ALLERGIES: Allergies  Allergen Reactions  . Sulfa Antibiotics Rash    HOME MEDICATIONS: Outpatient Medications Prior to Visit  Medication Sig Dispense Refill  . allopurinol (ZYLOPRIM) 300 MG tablet Take 300 mg by mouth daily.    Marland Kitchen aspirin 81 MG tablet Take 81 mg by mouth daily.    Marland Kitchen b complex vitamins tablet Take 1 tablet by mouth daily.    Marland Kitchen FLUoxetine (PROZAC) 20 MG capsule Take 20 mg by mouth daily.    Marland Kitchen ibuprofen (ADVIL,MOTRIN) 200 MG tablet Take 400 mg by mouth daily as needed  for moderate pain.    . Omega-3 Fatty Acids (FISH OIL) 1000 MG CAPS Take 1 capsule by mouth daily.     . pravastatin (PRAVACHOL) 20 MG tablet Take 20 mg by mouth daily.    Marland Kitchen Propylene Glycol (SYSTANE COMPLETE OP) Place 1 drop into both eyes 2 (two) times daily as needed (dry eyes).    . tamsulosin (FLOMAX) 0.4 MG CAPS capsule Take 0.4 mg by mouth daily.     . traMADol (ULTRAM) 50 MG tablet Take 50 mg by mouth 2 (two) times daily as needed for moderate pain.     . Vitamin D, Ergocalciferol, (DRISDOL) 50000 UNITS CAPS capsule Take 50,000 Units by mouth 3 (three) times a week.     . cephALEXin (KEFLEX) 500 MG capsule Take 1 capsule (500 mg total) by mouth 4 (four) times daily. (Patient not taking: Reported on  09/26/2018) 10 capsule 0   No facility-administered medications prior to visit.    PAST MEDICAL HISTORY: Past Medical History:  Diagnosis Date  . Anxiety   . BPH (benign prostatic hyperplasia)   . Cancer ([place])    skin  . ED (erectile dysfunction)   . GERD (gastroesophageal reflux disease)   . Gout   . Hemorrhoids   . Hyperlipidemia   . Insomnia   . Lower back pain   . Lumbar stenosis   . Positional vertigo   . Post laminectomy syndrome   . Right knee pain   . S/P left knee arthroscopy   . Sleep apnea    wears CPAP  . Wears hearing aid in both ears     PAST SURGICAL HISTORY: Past Surgical History:  Procedure Laterality Date  . CATARACT EXTRACTION W/ INTRAOCULAR LENS  IMPLANT, BILATERAL    . COLONOSCOPY    . KNEE SURGERY    . LUMBAR SPINE SURGERY  4/10, 6/10  . MOHS SURGERY    . SPINAL CORD STIMULATOR INSERTION N/A 05/09/2018   Procedure: LUMBAR SPINAL CORD STIMULATOR INSERTION;  Surgeon: Clydell Hakim, MD;  Location: [facility];  Service: Neurosurgery;  Laterality: N/A;  LUMBAR SPINAL CORD STIMULATOR INSERTION  . TOTAL HIP ARTHROPLASTY    . WISDOM TOOTH EXTRACTION      FAMILY HISTORY: Family History  Problem Relation Age of Onset  . Kidney disease Father   . Diabetes Mother   . Hip fracture Mother     SOCIAL HISTORY: Social History   Socioeconomic History  . Marital status: Married    Spouse name: Not on file  . Number of children: 2  . Years of education: College  . Highest education level: Not on file  Occupational History  . Occupation: Retired  Tobacco Use  . Smoking status: Never Smoker  . Smokeless tobacco: Never Used  Substance and Sexual Activity  . Alcohol use: Yes    Alcohol/week: 0.0 standard drinks    Comment: occasional beer  . Drug use: No  . Sexual activity: Not on file  Other Topics Concern  . Not on file  Social History Narrative  . Not on file   Social Determinants of Health   Financial Resource Strain:   . Difficulty of Paying  Living Expenses: Not on file  Food Insecurity:   . Worried About Charity fundraiser in the Last Year: Not on file  . Ran Out of Food in the Last Year: Not on file  Transportation Needs:   . Lack of Transportation (Medical): Not on file  . Lack of Transportation (Non-Medical):  Not on file  Physical Activity:   . Days of Exercise per Week: Not on file  . Minutes of Exercise per Session: Not on file  Stress:   . Feeling of Stress : Not on file  Social Connections:   . Frequency of Communication with Friends and Family: Not on file  . Frequency of Social Gatherings with Friends and Family: Not on file  . Attends Religious Services: Not on file  . Active Member of Clubs or Organizations: Not on file  . Attends Archivist Meetings: Not on file  . Marital Status: Not on file  Intimate Partner Violence:   . Fear of Current or Ex-Partner: Not on file  . Emotionally Abused: Not on file  . Physically Abused: Not on file  . Sexually Abused: Not on file      PHYSICAL EXAM  Vitals:   05/19/19 0736  BP: 124/68  Pulse: (!) 58  Temp: (!) 97.3 F (36.3 C)  TempSrc: Oral  Weight: 213 lb (96.6 kg)  Height: 5\' 8"  (1.727 m)   Body mass index is 32.39 kg/m.  Generalized: Well developed, in no acute distress  Chest: Lungs clear to auscultation bilaterally  Neurological examination  Mentation: Alert oriented to time, place, history taking. Follows all commands speech and language fluent Cranial nerve II-XII: Extraocular movements were full, visual field were full on confrontational test Head turning and shoulder shrug  were normal and symmetric. Motor: The motor testing reveals 5 over 5 strength of all 4 extremities. Good symmetric motor tone is noted throughout.  Sensory: Sensory testing is intact to soft touch on all 4 extremities. No evidence of extinction is noted.  Gait and station: Gait is normal.    DIAGNOSTIC DATA (LABS, IMAGING, TESTING) - I reviewed patient records,  labs, notes, testing and imaging myself where available.  Lab Results  Component Value Date   WBC 11.0 (H) 09/26/2018   HGB 17.4 (H) 09/26/2018   HCT 51.5 09/26/2018   MCV 95.0 09/26/2018   PLT 186 09/26/2018      Component Value Date/Time   NA 138 09/26/2018 1325   K 4.6 09/26/2018 1325   CL 106 09/26/2018 1325   CO2 20 (L) 09/26/2018 1325   GLUCOSE 144 (H) 09/26/2018 1325   BUN 17 09/26/2018 1325   CREATININE 1.09 09/26/2018 1325   CALCIUM 9.2 09/26/2018 1325   PROT 7.1 09/26/2018 1325   ALBUMIN 4.1 09/26/2018 1325   AST 39 09/26/2018 1325   ALT 47 (H) 09/26/2018 1325   ALKPHOS 53 09/26/2018 1325   BILITOT 0.6 09/26/2018 1325   GFRNONAA >60 09/26/2018 1325   GFRAA >60 09/26/2018 1325   No results found for: CHOL, HDL, LDLCALC, LDLDIRECT, TRIG, CHOLHDL No results found for: HGBA1C No results found for: VITAMINB12 No results found for: TSH    ASSESSMENT AND PLAN 84 y.o. year old male  has a past medical history of Anxiety, BPH (benign prostatic hyperplasia), Cancer ([place]), ED (erectile dysfunction), GERD (gastroesophageal reflux disease), Gout, Hemorrhoids, Hyperlipidemia, Insomnia, Lower back pain, Lumbar stenosis, Positional vertigo, Post laminectomy syndrome, Right knee pain, S/P left knee arthroscopy, Sleep apnea, and Wears hearing aid in both ears. here with :  1.  Obstructive sleep apnea on CPAP 2. REM sleep behavior disorder  -Download shows good compliance and treatment of apnea -Discussed trying melatonin 1 to 3 mg 2 hours before bedtime -Encouraged patient to continue using the CPAP nightly and greater than 4 hours each night -Order sent for  new machine and supplies  -Follow-up in 1 year or sooner if needed   I spent 15 minutes with the patient. 50% of this time was spent reviewing CPAP download   Ward Givens, MSN, NP-C 05/19/2019, 8:05 AM [facility] [address], [place], [place] [address] [phone]

## 2019-05-26 NOTE — Telephone Encounter (Signed)
Called to relay information, please see previous conversation.  Please relay if pt calls back.

## 2019-06-09 ENCOUNTER — Ambulatory Visit: Payer: Medicare Other | Attending: Internal Medicine

## 2019-06-09 DIAGNOSIS — Z23 Encounter for immunization: Secondary | ICD-10-CM

## 2019-06-09 NOTE — Progress Notes (Signed)
   Covid-19 Vaccination Clinic  Name:  Jose Farrell    MRN: [id number] DOB: October 01, 1935  06/09/2019  Mr. Jose Farrell was observed post Covid-19 immunization for 15 minutes without incident. He was provided with Vaccine Information Sheet and instruction to access the V-Safe system.   Mr. Jose Farrell was instructed to call 911 with any severe reactions post vaccine: Marland Kitchen Difficulty breathing  . Swelling of face and throat  . A fast heartbeat  . A bad rash all over body  . Dizziness and weakness   Immunizations Administered    Name Date Dose VIS Date Route   Pfizer COVID-19 Vaccine 06/09/2019  3:31 PM 0.3 mL 02/27/2019 Intramuscular   Manufacturer: [employer], [employer]   Lot: [id number]   [place]: [id number]

## 2019-08-19 ENCOUNTER — Telehealth: Payer: Self-pay | Admitting: *Deleted

## 2019-08-19 NOTE — Telephone Encounter (Signed)
Received fax new cpap Airsense Auto cpap DEVICE SN [id number] Modem SN 530 Settings 6cmh20 Mask Brevida pillow med/lge. Replacement cpap 08-18-19.

## 2019-08-25 ENCOUNTER — Telehealth: Payer: Self-pay

## 2019-08-25 NOTE — Telephone Encounter (Signed)
appt has been rescheduled for 8/24 21 per [employer] request.

## 2019-11-10 ENCOUNTER — Ambulatory Visit: Payer: Medicare Other | Admitting: Adult Health

## 2019-11-25 ENCOUNTER — Ambulatory Visit: Payer: Medicare Other | Admitting: Adult Health

## 2020-02-10 ENCOUNTER — Ambulatory Visit: Payer: Medicare Other | Admitting: Adult Health

## 2020-02-16 ENCOUNTER — Ambulatory Visit: Payer: Medicare Other | Admitting: Adult Health

## 2020-05-05 ENCOUNTER — Telehealth: Payer: Self-pay | Admitting: *Deleted

## 2020-05-05 NOTE — Telephone Encounter (Signed)
-----   Message from Ward Givens, NP sent at 05/04/2020  3:07 PM EST ----- This patient called and he said [employer] told him that they did not get all the information needed for his supplies and machine back in 2021. They have billed him for $600. Looking over it I am not sure sandy sent office notes with order. They will need these if not.I cant think of anything else they may need. When I called him back he said he had just lost his wife. He was crying. I felt so bad.

## 2020-05-05 NOTE — Telephone Encounter (Signed)
I called and spoke to [place] in asset recovery [phone], fax [phone].  He stated that they needed ofv notes from after pt received new machine around 7-10/2019.  Pt has nt been in , has had 4 cancelled appts.  (had appt 11-25-19 cancelled needed appt between 09/18/19 and 11/16/19 thei was changed to 10-21-19, called and cancelled was OOT. Then 04-12-19 wife ill, then 02/16/20 pt ill.  So we do not have ofv note to offer them only 05/2019 when last seen.  Gerald Stabs said would suspend bill, we need to see pt and send ofv note to them at above listed fax #.  Will resend with ofv note and see what they say.

## 2020-05-05 NOTE — Telephone Encounter (Signed)
yes

## 2020-05-09 NOTE — Telephone Encounter (Signed)
appt made for MCVV at 1100 ok per MM/NP. For f/u cpap.  Cpap DL printed to CB/CMA.

## 2020-05-10 ENCOUNTER — Telehealth (INDEPENDENT_AMBULATORY_CARE_PROVIDER_SITE_OTHER): Payer: Medicare Other | Admitting: Adult Health

## 2020-05-10 DIAGNOSIS — Z9989 Dependence on other enabling machines and devices: Secondary | ICD-10-CM | POA: Diagnosis not present

## 2020-05-10 DIAGNOSIS — G4733 Obstructive sleep apnea (adult) (pediatric): Secondary | ICD-10-CM | POA: Diagnosis not present

## 2020-05-10 NOTE — Progress Notes (Addendum)
PATIENT: Jose Farrell DOB: Feb 07, 1936  REASON FOR VISIT: follow up HISTORY FROM: patient  Virtual Visit via Video Note  I connected with Jose Farrell on 05/10/20 at 11:00 AM EST by a video enabled telemedicine application located remotely at [facility] and verified that I am speaking with the correct person using two identifiers who was located at their own home.   I discussed the limitations of evaluation and management by telemedicine and the availability of in person appointments. The patient expressed understanding and agreed to proceed.   PATIENT: Jose Farrell DOB: 1936/01/16  REASON FOR VISIT: follow up HISTORY FROM: patient  HISTORY OF PRESENT ILLNESS: Today 05/10/20:  Jose Farrell is an 85 year old male with a history of obstructive sleep apnea on CPAP.  He returns today for follow-up.  His download indicates that he uses machine nightly for compliance of 100%.  He uses machine greater than 4 hours each night.  On average he uses his machine 8 hours and 9 minutes.  His residual AHI is 0.7 on 6 cm of water with EPR of 1.  He does not have any significant leak.  He reports that the CPAP is working well for him.  He denies any new issues.    HISTORY 05/19/19:  Jose Farrell is an 85 year old male with a history of obstructive sleep apnea on CPAP.  His download indicates that he uses machine 28 out of 30 days for compliance of 93%.  He uses machine greater than 4 hours 27 days for compliance of 90%.  On average he uses his machine 8 hours and 55 minutes.  His residual AHI is 1.6 on 6 cm of water with EPR 3.  He reports that the CPAP is working well for him.  He would like a new machine and supplies.  He states that there are some nights he does act out his dreams.  He has not fallen out of bed recently.  He also states that there are times he has trouble falling asleep.  In the past he has tried Klonopin and trazodone.  He returns today for an  evaluation.  REVIEW OF SYSTEMS: Out of a complete 14 system review of symptoms, the patient complains only of the following symptoms, and all other reviewed systems are negative.  ALLERGIES: Allergies  Allergen Reactions  . Sulfa Antibiotics Rash    HOME MEDICATIONS: Outpatient Medications Prior to Visit  Medication Sig Dispense Refill  . allopurinol (ZYLOPRIM) 300 MG tablet Take 300 mg by mouth daily.    Marland Kitchen aspirin 81 MG tablet Take 81 mg by mouth daily.    Marland Kitchen b complex vitamins tablet Take 1 tablet by mouth daily.    Marland Kitchen FLUoxetine (PROZAC) 20 MG capsule Take 20 mg by mouth daily.    Marland Kitchen ibuprofen (ADVIL,MOTRIN) 200 MG tablet Take 400 mg by mouth daily as needed for moderate pain.    . Omega-3 Fatty Acids (FISH OIL) 1000 MG CAPS Take 1 capsule by mouth daily.     . pravastatin (PRAVACHOL) 20 MG tablet Take 20 mg by mouth daily.    Marland Kitchen Propylene Glycol (SYSTANE COMPLETE OP) Place 1 drop into both eyes 2 (two) times daily as needed (dry eyes).    . tamsulosin (FLOMAX) 0.4 MG CAPS capsule Take 0.4 mg by mouth daily.     . traMADol (ULTRAM) 50 MG tablet Take 50 mg by mouth 2 (two) times daily as needed for moderate pain.     Marland Kitchen  Vitamin D, Ergocalciferol, (DRISDOL) 50000 UNITS CAPS capsule Take 50,000 Units by mouth 3 (three) times a week.      No facility-administered medications prior to visit.    PAST MEDICAL HISTORY: Past Medical History:  Diagnosis Date  . Anxiety   . BPH (benign prostatic hyperplasia)   . Cancer ([place])    skin  . ED (erectile dysfunction)   . GERD (gastroesophageal reflux disease)   . Gout   . Hemorrhoids   . Hyperlipidemia   . Insomnia   . Lower back pain   . Lumbar stenosis   . Positional vertigo   . Post laminectomy syndrome   . Right knee pain   . S/P left knee arthroscopy   . Sleep apnea    wears CPAP  . Wears hearing aid in both ears     PAST SURGICAL HISTORY: Past Surgical History:  Procedure Laterality Date  . CATARACT EXTRACTION W/ INTRAOCULAR  LENS  IMPLANT, BILATERAL    . COLONOSCOPY    . KNEE SURGERY    . LUMBAR SPINE SURGERY  4/10, 6/10  . MOHS SURGERY    . SPINAL CORD STIMULATOR INSERTION N/A 05/09/2018   Procedure: LUMBAR SPINAL CORD STIMULATOR INSERTION;  Surgeon: Clydell Hakim, MD;  Location: [place];  Service: Neurosurgery;  Laterality: N/A;  LUMBAR SPINAL CORD STIMULATOR INSERTION  . TOTAL HIP ARTHROPLASTY    . WISDOM TOOTH EXTRACTION      FAMILY HISTORY: Family History  Problem Relation Age of Onset  . Kidney disease Father   . Diabetes Mother   . Hip fracture Mother     SOCIAL HISTORY: Social History   Socioeconomic History  . Marital status: Married    Spouse name: Not on file  . Number of children: 2  . Years of education: College  . Highest education level: Not on file  Occupational History  . Occupation: Retired  Tobacco Use  . Smoking status: Never Smoker  . Smokeless tobacco: Never Used  Vaping Use  . Vaping Use: Never used  Substance and Sexual Activity  . Alcohol use: Yes    Alcohol/week: 0.0 standard drinks    Comment: occasional beer  . Drug use: No  . Sexual activity: Not on file  Other Topics Concern  . Not on file  Social History Narrative  . Not on file   Social Determinants of Health   Financial Resource Strain: Not on file  Food Insecurity: Not on file  Transportation Needs: Not on file  Physical Activity: Not on file  Stress: Not on file  Social Connections: Not on file  Intimate Partner Violence: Not on file      PHYSICAL EXAM Generalized: Well developed, in no acute distress   Neurological examination  Mentation: Alert oriented to time, place, history taking. Follows all commands speech and language fluent Cranial nerve II-XII:Extraocular movements were full. Facial symmetry noted. uvula tongue midline. Head turning and shoulder shrug  were normal and symmetric. Motor: Good strength throughout subjectively per patient Sensory: Sensory testing is intact to soft  touch on all 4 extremities subjectively per patient Coordination: Cerebellar testing reveals good finger-nose-finger  Gait and station: Patient is able to stand from a seated position. gait is normal.  Reflexes: UTA  DIAGNOSTIC DATA (LABS, IMAGING, TESTING) - I reviewed patient records, labs, notes, testing and imaging myself where available.  Lab Results  Component Value Date   WBC 11.0 (H) 09/26/2018   HGB 17.4 (H) 09/26/2018   HCT 51.5 09/26/2018  MCV 95.0 09/26/2018   PLT 186 09/26/2018      Component Value Date/Time   NA 138 09/26/2018 1325   K 4.6 09/26/2018 1325   CL 106 09/26/2018 1325   CO2 20 (L) 09/26/2018 1325   GLUCOSE 144 (H) 09/26/2018 1325   BUN 17 09/26/2018 1325   CREATININE 1.09 09/26/2018 1325   CALCIUM 9.2 09/26/2018 1325   PROT 7.1 09/26/2018 1325   ALBUMIN 4.1 09/26/2018 1325   AST 39 09/26/2018 1325   ALT 47 (H) 09/26/2018 1325   ALKPHOS 53 09/26/2018 1325   BILITOT 0.6 09/26/2018 1325   GFRNONAA >60 09/26/2018 1325   GFRAA >60 09/26/2018 1325      ASSESSMENT AND PLAN 85 y.o. year old male  has a past medical history of Anxiety, BPH (benign prostatic hyperplasia), Cancer ([place]), ED (erectile dysfunction), GERD (gastroesophageal reflux disease), Gout, Hemorrhoids, Hyperlipidemia, Insomnia, Lower back pain, Lumbar stenosis, Positional vertigo, Post laminectomy syndrome, Right knee pain, S/P left knee arthroscopy, Sleep apnea, and Wears hearing aid in both ears. here with:  OSA on CPAP  . CPAP compliance excellent . Residual AHI is good . Encouraged patient to continue using CPAP nightly and > 4 hours each night . F/U in 1 year or sooner if needed  I spent 20 minutes of face-to-face and non-face-to-face time with patient.  This included previsit chart review, lab review, study review, order entry, electronic health record documentation, patient education.  Ward Givens, MSN, NP-C 05/10/2020, 11:06 AM [facility] [address] [place], [place] [address] [phone]  I reviewed the above note and documentation by the Nurse Practitioner and agree with the history, exam, assessment and plan as outlined above. I was available for consultation. [name], MD, PhD [facility])

## 2020-06-20 ENCOUNTER — Ambulatory Visit: Payer: Medicare Other | Admitting: Adult Health

## 2020-06-21 ENCOUNTER — Telehealth: Payer: Self-pay | Admitting: Adult Health

## 2020-10-25 ENCOUNTER — Telehealth: Payer: Self-pay | Admitting: Adult Health

## 2020-10-25 NOTE — Telephone Encounter (Signed)
..   Pt understands that although there may be some limitations with this type of visit, we will take all precautions to reduce any security or privacy concerns.  Pt understands that this will be treated like an in office visit and we will file with pt's insurance, and there may be a patient responsible charge related to this service. ? ?

## 2020-10-25 NOTE — Telephone Encounter (Signed)
noted 

## 2020-10-26 NOTE — Telephone Encounter (Signed)
Checking with [employer] about cpap DL for pt. (Sent community message).

## 2020-10-26 NOTE — Telephone Encounter (Signed)
I called the patient.  He had not uses his CPAP machine for the last 2 months due to his wife's passing but also skin cancer where the strap with his mask would hit.  He stated he will start to use this.  I relayed I could not get a download on the machine.  This may be because cell tower 3G to 5G change.  I told him at his next appointment to bring his machine/cord so we could upload his CPAP information.  He verbalized understanding and appointment for tomorrow morning was canceled he will call to reschedule at a later time.

## 2020-10-27 ENCOUNTER — Telehealth: Payer: Medicare Other | Admitting: Adult Health

## 2021-05-09 ENCOUNTER — Telehealth (INDEPENDENT_AMBULATORY_CARE_PROVIDER_SITE_OTHER): Payer: Medicare Other | Admitting: Adult Health

## 2021-05-09 DIAGNOSIS — G4733 Obstructive sleep apnea (adult) (pediatric): Secondary | ICD-10-CM | POA: Diagnosis not present

## 2021-05-09 DIAGNOSIS — Z9989 Dependence on other enabling machines and devices: Secondary | ICD-10-CM | POA: Diagnosis not present

## 2021-05-09 NOTE — Progress Notes (Signed)
PATIENT: Jose Farrell DOB: 07/06/1935  REASON FOR VISIT: follow up HISTORY FROM: patient  Virtual Visit via Video Note  I connected with Jose Farrell on 05/09/21 at  1:45 PM EST by a video enabled telemedicine application located remotely at [facility] and verified that I am speaking with the correct person using two identifiers who was located at their own home.   I discussed the limitations of evaluation and management by telemedicine and the availability of in person appointments. The patient expressed understanding and agreed to proceed.   PATIENT: Jose Farrell DOB: 1935-07-24  REASON FOR VISIT: follow up HISTORY FROM: patient  HISTORY OF PRESENT ILLNESS: Today 05/09/21:  Jose Farrell is an 86 year old male with a history of obstructive sleep apnea on CPAP.  He returns today for follow-up.  His CPAP report is below.  He states sometimes he gets a dry mouth.  He has not tried adjusting his humidification.    05/10/20: Jose Farrell is an 86 year old male with a history of obstructive sleep apnea on CPAP.  He returns today for follow-up.  His download indicates that he uses machine nightly for compliance of 100%.  He uses machine greater than 4 hours each night.  On average he uses his machine 8 hours and 9 minutes.  His residual AHI is 0.7 on 6 cm of water with EPR of 1.  He does not have any significant leak.  He reports that the CPAP is working well for him.  He denies any new issues.    HISTORY 05/19/19:   Jose Farrell is an 86 year old male with a history of obstructive sleep apnea on CPAP.  His download indicates that he uses machine 28 out of 30 days for compliance of 93%.  He uses machine greater than 4 hours 27 days for compliance of 90%.  On average he uses his machine 8 hours and 55 minutes.  His residual AHI is 1.6 on 6 cm of water with EPR 3.  He reports that the CPAP is working well for him.  He would like a new machine and supplies.  He  states that there are some nights he does act out his dreams.  He has not fallen out of bed recently.  He also states that there are times he has trouble falling asleep.  In the past he has tried Klonopin and trazodone.  He returns today for an evaluation.  REVIEW OF SYSTEMS: Out of a complete 14 system review of symptoms, the patient complains only of the following symptoms, and all other reviewed systems are negative.  ALLERGIES: Allergies  Allergen Reactions   Sulfa Antibiotics Rash    HOME MEDICATIONS: Outpatient Medications Prior to Visit  Medication Sig Dispense Refill   allopurinol (ZYLOPRIM) 300 MG tablet Take 300 mg by mouth daily.     aspirin 81 MG tablet Take 81 mg by mouth daily.     b complex vitamins tablet Take 1 tablet by mouth daily.     FLUoxetine (PROZAC) 20 MG capsule Take 20 mg by mouth daily.     ibuprofen (ADVIL,MOTRIN) 200 MG tablet Take 400 mg by mouth daily as needed for moderate pain.     Omega-3 Fatty Acids (FISH OIL) 1000 MG CAPS Take 1 capsule by mouth daily.      pravastatin (PRAVACHOL) 20 MG tablet Take 20 mg by mouth daily.     Propylene Glycol (SYSTANE COMPLETE OP) Place 1 drop into both eyes 2 (two)  times daily as needed (dry eyes).     tamsulosin (FLOMAX) 0.4 MG CAPS capsule Take 0.4 mg by mouth daily.      traMADol (ULTRAM) 50 MG tablet Take 50 mg by mouth 2 (two) times daily as needed for moderate pain.      Vitamin D, Ergocalciferol, (DRISDOL) 50000 UNITS CAPS capsule Take 50,000 Units by mouth 3 (three) times a week.      No facility-administered medications prior to visit.    PAST MEDICAL HISTORY: Past Medical History:  Diagnosis Date   Anxiety    BPH (benign prostatic hyperplasia)    Cancer (HCC)    skin   ED (erectile dysfunction)    GERD (gastroesophageal reflux disease)    Gout    Hemorrhoids    Hyperlipidemia    Insomnia    Lower back pain    Lumbar stenosis    Positional vertigo    Post laminectomy syndrome    Right knee pain     S/P left knee arthroscopy    Sleep apnea    wears CPAP   Wears hearing aid in both ears     PAST SURGICAL HISTORY: Past Surgical History:  Procedure Laterality Date   CATARACT EXTRACTION W/ INTRAOCULAR LENS  IMPLANT, BILATERAL     COLONOSCOPY     KNEE SURGERY     LUMBAR SPINE SURGERY  4/10, 6/10   MOHS SURGERY     SPINAL CORD STIMULATOR INSERTION N/A 05/09/2018   Procedure: LUMBAR SPINAL CORD STIMULATOR INSERTION;  Surgeon: Clydell Hakim, MD;  Location: [facility];  Service: Neurosurgery;  Laterality: N/A;  LUMBAR SPINAL CORD STIMULATOR INSERTION   TOTAL HIP ARTHROPLASTY     WISDOM TOOTH EXTRACTION      FAMILY HISTORY: Family History  Problem Relation Age of Onset   Kidney disease Father    Diabetes Mother    Hip fracture Mother     SOCIAL HISTORY: Social History   Socioeconomic History   Marital status: Married    Spouse name: Not on file   Number of children: 2   Years of education: College   Highest education level: Not on file  Occupational History   Occupation: Retired  Tobacco Use   Smoking status: Never   Smokeless tobacco: Never  Vaping Use   Vaping Use: Never used  Substance and Sexual Activity   Alcohol use: Yes    Alcohol/week: 0.0 standard drinks    Comment: occasional beer   Drug use: No   Sexual activity: Not on file  Other Topics Concern   Not on file  Social History Narrative   Not on file   Social Determinants of Health   Financial Resource Strain: Not on file  Food Insecurity: Not on file  Transportation Needs: Not on file  Physical Activity: Not on file  Stress: Not on file  Social Connections: Not on file  Intimate Partner Violence: Not on file      PHYSICAL EXAM Generalized: Well developed, in no acute distress   Neurological examination  Mentation: Alert oriented to time, place, history taking. Follows all commands speech and language fluent Cranial nerve II-XII:Extraocular movements were full. Facial symmetry noted. uvula  tongue midline. Head turning and shoulder shrug  were normal and symmetric.  DIAGNOSTIC DATA (LABS, IMAGING, TESTING) - I reviewed patient records, labs, notes, testing and imaging myself where available.  Lab Results  Component Value Date   WBC 11.0 (H) 09/26/2018   HGB 17.4 (H) 09/26/2018   HCT 51.5  09/26/2018   MCV 95.0 09/26/2018   PLT 186 09/26/2018      Component Value Date/Time   NA 138 09/26/2018 1325   K 4.6 09/26/2018 1325   CL 106 09/26/2018 1325   CO2 20 (L) 09/26/2018 1325   GLUCOSE 144 (H) 09/26/2018 1325   BUN 17 09/26/2018 1325   CREATININE 1.09 09/26/2018 1325   CALCIUM 9.2 09/26/2018 1325   PROT 7.1 09/26/2018 1325   ALBUMIN 4.1 09/26/2018 1325   AST 39 09/26/2018 1325   ALT 47 (H) 09/26/2018 1325   ALKPHOS 53 09/26/2018 1325   BILITOT 0.6 09/26/2018 1325   GFRNONAA >60 09/26/2018 1325   GFRAA >60 09/26/2018 1325      ASSESSMENT AND PLAN 86 y.o. year old male  has a past medical history of Anxiety, BPH (benign prostatic hyperplasia), Cancer ([place]), ED (erectile dysfunction), GERD (gastroesophageal reflux disease), Gout, Hemorrhoids, Hyperlipidemia, Insomnia, Lower back pain, Lumbar stenosis, Positional vertigo, Post laminectomy syndrome, Right knee pain, S/P left knee arthroscopy, Sleep apnea, and Wears hearing aid in both ears. here with:  OSA on CPAP  CPAP compliance excellent Residual AHI is good Encouraged patient to continue using CPAP nightly and > 4 hours each night Advised patient to try adjusting his humidification if no improvement we can do a mask refitting F/U in 1 year or sooner if needed     Ward Givens, MSN, NP-C 05/09/2021, 1:41 PM [facility] [address] [place], [place] [address] [phone]

## 2021-08-02 ENCOUNTER — Telehealth: Payer: Self-pay | Admitting: Adult Health

## 2021-08-02 DIAGNOSIS — G4733 Obstructive sleep apnea (adult) (pediatric): Secondary | ICD-10-CM

## 2021-08-02 NOTE — Telephone Encounter (Signed)
Order placed

## 2021-08-02 NOTE — Telephone Encounter (Signed)
Pt would like to know if he has had his CPAP long enough for a replacement, please call. ?

## 2021-08-02 NOTE — Telephone Encounter (Signed)
Spoke to pt I relayed that from what I could see  he received a machine initially 07-27-14 and then received a new one 08-18-2019.  He says that he says there is corrosion, water tray, straps.  I relayed that he can call [employer] [phone] and inquire and they would be able to tell him when he got last machine.  If needs supplies assist him and if needs order then we would be able to order supplies for him.  Last seen here 05-09-21 by MM/NP. ?

## 2021-08-02 NOTE — Addendum Note (Signed)
Addended by: Brandon Melnick on: 08/02/2021 02:58 PM ? ? Modules accepted: Orders ? ?

## 2021-08-02 NOTE — Telephone Encounter (Signed)
Pt states he spoke with DME and was told a Rx needs to be sent over for the CPAP supplies .  When faxing please send to the attention of [name] ?

## 2021-08-21 NOTE — Telephone Encounter (Signed)
Pt has called to report that he has checked in with [name] and she informed him they have not received any order from our office, please call.

## 2021-08-21 NOTE — Telephone Encounter (Signed)
I have refaxed to [phone] adapt to [facility].

## 2021-08-22 NOTE — Telephone Encounter (Signed)
RE: cpap supplies attention [name] Received: Today Denyse Amass, RN; Vanessa Ralphs Yes. She is one of our CSR's in the [place] office.      Previous Messages   ----- Message -----  From: Brandon Melnick, RN  Sent: 08/22/2021   1:08 PM EDT  To: Marchelle Gearing  Subject: FW: cpap supplies attention [name]               Were you  able to find the [name] that was waiting on this?  Lovey Newcomer RN  ----- Message -----  From: Marchelle Gearing  Sent: 08/22/2021  10:46 AM EDT  To: Vanessa Ralphs, Brandon Melnick, RN  Subject: RE: cpap supplies attention [name]               got it!   ----- Message -----  From: Brandon Melnick, RN  Sent: 08/22/2021   8:10 AM EDT  To: Ocie Bob  Subject: cpap supplies attention [name]                   Good morning,   Can you follow up on this please.  For supplies for this pt.  It says attention [name], not sure who that is.  But if you could make sure he gets supplies.  Appreciate it.   Shafer L. Housel  Male, 86 y.o., [birth year]  MRN:  [id number]   [facility]

## 2021-08-22 NOTE — Telephone Encounter (Signed)
Denyse Amass, RN; Vanessa Ralphs got it!      Previous Messages   ----- Message -----  From: Brandon Melnick, RN  Sent: 08/22/2021   8:10 AM EDT  To: Ocie Bob  Subject: cpap supplies attention [name]                   Good morning,   Can you follow up on this please.  For supplies for this pt.  It says attention [name], not sure who that is.  But if you could make sure he gets supplies.  Appreciate it.   Jose Farrell  Male, 86 y.o., 1935/09/26  MRN:  [id number]   [facility]

## 2021-08-24 NOTE — Telephone Encounter (Signed)
Spoke to pt and he stated that supplies are in the mail.  He has over the last 2-3 wks noticed dry mouth.  He wears nasal pillows.  He thinks possible needs new supplies.  If still problem will let us know.  Biotene mouth products, humidity, if sleeps with mouth open chin straps?  Other medications that cause dry mouth. He does not take antihistamines.  He appreciated follow up.

## 2022-04-25 ENCOUNTER — Other Ambulatory Visit: Payer: Self-pay | Admitting: Internal Medicine

## 2022-04-25 DIAGNOSIS — M5416 Radiculopathy, lumbar region: Secondary | ICD-10-CM

## 2022-05-14 ENCOUNTER — Encounter: Payer: Self-pay | Admitting: *Deleted

## 2022-05-14 NOTE — Progress Notes (Unsigned)
PATIENT: Jose Farrell DOB: 1935-07-21  REASON FOR VISIT: follow up HISTORY FROM: patient  Virtual Visit via Video Note  I connected with Audi L Mcdonnell on 05/15/22 at  1:45 PM EST by a video enabled telemedicine application located remotely at [facility] and verified that I am speaking with the correct person using two identifiers who was located at their own home.   I discussed the limitations of evaluation and management by telemedicine and the availability of in person appointments. The patient expressed understanding and agreed to proceed.   PATIENT: Jose Farrell DOB: 1936/03/09  REASON FOR VISIT: follow up HISTORY FROM: patient  HISTORY OF PRESENT ILLNESS: Today 05/15/22:  Jose Farrell is a 87 y.o. male with a history of OSA on CPAP. Returns today for follow-up.  Reports that CPAP works well.  States that he does get a dry mouth.  Currently wearing the nasal pillows has tried a fullface mask and chinstrap in the past without not helpful.  Has never adjusted his humidification.  Download is below       05/09/21: Jose Farrell is an 87 year old male with a history of obstructive sleep apnea on CPAP.  He returns today for follow-up.  His CPAP report is below.  He states sometimes he gets a dry mouth.  He has not tried adjusting his humidification.    05/10/20: Jose Farrell is an 87 year old male with a history of obstructive sleep apnea on CPAP.  He returns today for follow-up.  His download indicates that he uses machine nightly for compliance of 100%.  He uses machine greater than 4 hours each night.  On average he uses his machine 8 hours and 9 minutes.  His residual AHI is 0.7 on 6 cm of water with EPR of 1.  He does not have any significant leak.  He reports that the CPAP is working well for him.  He denies any new issues.    HISTORY 05/19/19:   Jose Farrell is an 87 year old male with a history of obstructive sleep apnea on CPAP.  His  download indicates that he uses machine 28 out of 30 days for compliance of 93%.  He uses machine greater than 4 hours 27 days for compliance of 90%.  On average he uses his machine 8 hours and 55 minutes.  His residual AHI is 1.6 on 6 cm of water with EPR 3.  He reports that the CPAP is working well for him.  He would like a new machine and supplies.  He states that there are some nights he does act out his dreams.  He has not fallen out of bed recently.  He also states that there are times he has trouble falling asleep.  In the past he has tried Klonopin and trazodone.  He returns today for an evaluation.  REVIEW OF SYSTEMS: Out of a complete 14 system review of symptoms, the patient complains only of the following symptoms, and all other reviewed systems are negative.  ALLERGIES: Allergies  Allergen Reactions   Sulfa Antibiotics Rash    HOME MEDICATIONS: Outpatient Medications Prior to Visit  Medication Sig Dispense Refill   allopurinol (ZYLOPRIM) 300 MG tablet Take 300 mg by mouth daily.     aspirin 81 MG tablet Take 81 mg by mouth daily.     b complex vitamins tablet Take 1 tablet by mouth daily.     FLUoxetine (PROZAC) 20 MG capsule Take 20 mg by mouth daily.  ibuprofen (ADVIL,MOTRIN) 200 MG tablet Take 400 mg by mouth daily as needed for moderate pain.     Omega-3 Fatty Acids (FISH OIL) 1000 MG CAPS Take 1 capsule by mouth daily.      pravastatin (PRAVACHOL) 20 MG tablet Take 20 mg by mouth daily.     Propylene Glycol (SYSTANE COMPLETE OP) Place 1 drop into both eyes 2 (two) times daily as needed (dry eyes).     tamsulosin (FLOMAX) 0.4 MG CAPS capsule Take 0.4 mg by mouth daily.      traMADol (ULTRAM) 50 MG tablet Take 50 mg by mouth 2 (two) times daily as needed for moderate pain.      Vitamin D, Ergocalciferol, (DRISDOL) 50000 UNITS CAPS capsule Take 50,000 Units by mouth 3 (three) times a week.      No facility-administered medications prior to visit.    PAST MEDICAL  HISTORY: Past Medical History:  Diagnosis Date   Anxiety    BPH (benign prostatic hyperplasia)    Cancer (HCC)    skin   ED (erectile dysfunction)    GERD (gastroesophageal reflux disease)    Gout    Hemorrhoids    Hyperlipidemia    Insomnia    Lower back pain    Lumbar stenosis    Positional vertigo    Post laminectomy syndrome    Right knee pain    S/P left knee arthroscopy    Sleep apnea    wears CPAP   Wears hearing aid in both ears     PAST SURGICAL HISTORY: Past Surgical History:  Procedure Laterality Date   CATARACT EXTRACTION W/ INTRAOCULAR LENS  IMPLANT, BILATERAL     COLONOSCOPY     KNEE SURGERY     LUMBAR SPINE SURGERY  4/10, 6/10   MOHS SURGERY     SPINAL CORD STIMULATOR INSERTION N/A 05/09/2018   Procedure: LUMBAR SPINAL CORD STIMULATOR INSERTION;  Surgeon: Odette Fraction, MD;  Location: [facility];  Service: Neurosurgery;  Laterality: N/A;  LUMBAR SPINAL CORD STIMULATOR INSERTION   TOTAL HIP ARTHROPLASTY     WISDOM TOOTH EXTRACTION      FAMILY HISTORY: Family History  Problem Relation Age of Onset   Kidney disease Father    Diabetes Mother    Hip fracture Mother     SOCIAL HISTORY: Social History   Socioeconomic History   Marital status: Married    Spouse name: Not on file   Number of children: 2   Years of education: College   Highest education level: Not on file  Occupational History   Occupation: Retired  Tobacco Use   Smoking status: Never   Smokeless tobacco: Never  Vaping Use   Vaping Use: Never used  Substance and Sexual Activity   Alcohol use: Yes    Alcohol/week: 0.0 standard drinks of alcohol    Comment: occasional beer   Drug use: No   Sexual activity: Not on file  Other Topics Concern   Not on file  Social History Narrative   Not on file   Social Determinants of Health   Financial Resource Strain: Not on file  Food Insecurity: Not on file  Transportation Needs: Not on file  Physical Activity: Not on file  Stress: Not on  file  Social Connections: Not on file  Intimate Partner Violence: Not on file      PHYSICAL EXAM Generalized: Well developed, in no acute distress   Neurological examination  Mentation: Alert oriented to time, place, history taking. Follows all  commands speech and language fluent Cranial nerve II-XII:Extraocular movements were full. Facial symmetry noted. Head turning and shoulder shrug  were normal and symmetric.  DIAGNOSTIC DATA (LABS, IMAGING, TESTING) - I reviewed patient records, labs, notes, testing and imaging myself where available.  Lab Results  Component Value Date   WBC 11.0 (H) 09/26/2018   HGB 17.4 (H) 09/26/2018   HCT 51.5 09/26/2018   MCV 95.0 09/26/2018   PLT 186 09/26/2018      Component Value Date/Time   NA 138 09/26/2018 1325   K 4.6 09/26/2018 1325   CL 106 09/26/2018 1325   CO2 20 (L) 09/26/2018 1325   GLUCOSE 144 (H) 09/26/2018 1325   BUN 17 09/26/2018 1325   CREATININE 1.09 09/26/2018 1325   CALCIUM 9.2 09/26/2018 1325   PROT 7.1 09/26/2018 1325   ALBUMIN 4.1 09/26/2018 1325   AST 39 09/26/2018 1325   ALT 47 (H) 09/26/2018 1325   ALKPHOS 53 09/26/2018 1325   BILITOT 0.6 09/26/2018 1325   GFRNONAA >60 09/26/2018 1325   GFRAA >60 09/26/2018 1325      ASSESSMENT AND PLAN 87 y.o. year old male  has a past medical history of Anxiety, BPH (benign prostatic hyperplasia), Cancer (HCC), ED (erectile dysfunction), GERD (gastroesophageal reflux disease), Gout, Hemorrhoids, Hyperlipidemia, Insomnia, Lower back pain, Lumbar stenosis, Positional vertigo, Post laminectomy syndrome, Right knee pain, S/P left knee arthroscopy, Sleep apnea, and Wears hearing aid in both ears. here with:  OSA on CPAP  CPAP compliance excellent Residual AHI is good Encouraged patient to continue using CPAP nightly and > 4 hours each night Order sent to his DME company to asked them to help with his humidification, mask refitting also sent F/U in 6 months or sooner if  needed     Butch Penny, MSN, NP-C 05/15/2022, 1:53 PM [facility] [address], [address] [place], [place] [address] [phone]

## 2022-05-15 ENCOUNTER — Telehealth (INDEPENDENT_AMBULATORY_CARE_PROVIDER_SITE_OTHER): Payer: Medicare Other | Admitting: Adult Health

## 2022-05-15 DIAGNOSIS — G4733 Obstructive sleep apnea (adult) (pediatric): Secondary | ICD-10-CM

## 2022-11-02 ENCOUNTER — Telehealth: Payer: Medicare Other | Admitting: Adult Health

## 2023-01-10 ENCOUNTER — Other Ambulatory Visit: Payer: Self-pay | Admitting: Internal Medicine

## 2023-01-10 DIAGNOSIS — M5416 Radiculopathy, lumbar region: Secondary | ICD-10-CM

## 2023-01-16 NOTE — Discharge Instructions (Signed)

## 2023-01-17 ENCOUNTER — Ambulatory Visit
Admission: RE | Admit: 2023-01-17 | Discharge: 2023-01-17 | Disposition: A | Discharge status: Home / Self Care | Payer: Medicare Other | Source: Ambulatory Visit | Attending: Internal Medicine | Admitting: Internal Medicine

## 2023-01-17 DIAGNOSIS — M5416 Radiculopathy, lumbar region: Secondary | ICD-10-CM

## 2023-01-17 MED ORDER — DIAZEPAM 5 MG PO TABS: 5.0000 mg | ORAL_TABLET | Freq: Once | ORAL | Status: DC

## 2023-01-17 MED ORDER — ONDANSETRON HCL 4 MG/2ML IJ SOLN: 4.0000 mg | Freq: Once | INTRAMUSCULAR | Status: DC | PRN

## 2023-01-17 MED ORDER — MEPERIDINE HCL 50 MG/ML IJ SOLN: 50.0000 mg | Freq: Once | INTRAMUSCULAR | Status: DC | PRN

## 2023-01-17 MED ORDER — IOPAMIDOL (ISOVUE-M 200) INJECTION 41%
20.0000 mL | Freq: Once | INTRAMUSCULAR | Status: AC
  Administered 2023-01-17: 20 mL via INTRATHECAL

## 2023-01-17 NOTE — Progress Notes (Signed)
Pt reports his spinal cord stimulator has been turned off for the myelogram procedure.

## 2023-01-21 ENCOUNTER — Other Ambulatory Visit: Payer: Self-pay | Admitting: Internal Medicine

## 2023-01-21 DIAGNOSIS — M5416 Radiculopathy, lumbar region: Secondary | ICD-10-CM

## 2023-03-15 ENCOUNTER — Emergency Department (HOSPITAL_COMMUNITY)
Admission: EM | Admit: 2023-03-15 | Discharge: 2023-03-15 | Disposition: A | Discharge status: Home / Self Care | Payer: Medicare Other | Attending: Emergency Medicine | Admitting: Emergency Medicine

## 2023-03-15 ENCOUNTER — Encounter (HOSPITAL_COMMUNITY): Payer: Self-pay | Admitting: Emergency Medicine

## 2023-03-15 DIAGNOSIS — R42 Dizziness and giddiness: Secondary | ICD-10-CM | POA: Diagnosis present

## 2023-03-15 DIAGNOSIS — H81399 Other peripheral vertigo, unspecified ear: Secondary | ICD-10-CM | POA: Diagnosis not present

## 2023-03-15 DIAGNOSIS — Z85828 Personal history of other malignant neoplasm of skin: Secondary | ICD-10-CM | POA: Diagnosis not present

## 2023-03-15 LAB — COMPREHENSIVE METABOLIC PANEL
ALT: 36 U/L (ref 0–44)
AST: 34 U/L (ref 15–41)
Albumin: 3.9 g/dL (ref 3.5–5.0)
Alkaline Phosphatase: 55 U/L (ref 38–126)
Anion gap: 10 (ref 5–15)
BUN: 13 mg/dL (ref 8–23)
CO2: 22 mmol/L (ref 22–32)
Calcium: 9.9 mg/dL (ref 8.9–10.3)
Chloride: 105 mmol/L (ref 98–111)
Creatinine, Ser: 0.87 mg/dL (ref 0.61–1.24)
GFR, Estimated: >60 mL/min (ref >60)
Glucose, Bld: 108 mg/dL — ABNORMAL HIGH (ref 70–99)
Potassium: 4.2 mmol/L (ref 3.5–5.1)
Sodium: 137 mmol/L (ref 135–145)
Total Bilirubin: 1.1 mg/dL (ref <1.2)
Total Protein: 7 g/dL (ref 6.5–8.1)

## 2023-03-15 LAB — CBC WITH DIFFERENTIAL/PLATELET
Abs Immature Granulocytes: 0.04 10*3/uL (ref 0.00–0.07)
Basophils Absolute: 0.1 10*3/uL (ref 0.0–0.1)
Basophils Relative: 1 %
Eosinophils Absolute: 0.1 10*3/uL (ref 0.0–0.5)
Eosinophils Relative: 1 %
HCT: 48.2 % (ref 39.0–52.0)
Hemoglobin: 16.6 g/dL (ref 13.0–17.0)
Immature Granulocytes: 0 %
Lymphocytes Relative: 23 %
Lymphs Abs: 2.1 10*3/uL (ref 0.7–4.0)
MCH: 32.3 pg (ref 26.0–34.0)
MCHC: 34.4 g/dL (ref 30.0–36.0)
MCV: 93.8 fL (ref 80.0–100.0)
Monocytes Absolute: 0.9 10*3/uL (ref 0.1–1.0)
Monocytes Relative: 9 %
Neutro Abs: 5.9 10*3/uL (ref 1.7–7.7)
Neutrophils Relative %: 66 %
Platelets: 208 10*3/uL (ref 150–400)
RBC: 5.14 MIL/uL (ref 4.22–5.81)
RDW: 13.2 % (ref 11.5–15.5)
WBC: 9.2 10*3/uL (ref 4.0–10.5)
nRBC: 0 % (ref 0.0–0.2)

## 2023-03-15 LAB — URINALYSIS, ROUTINE W REFLEX MICROSCOPIC
Bilirubin Urine: NEGATIVE
Glucose, UA: NEGATIVE mg/dL
Hgb urine dipstick: NEGATIVE
Ketones, ur: NEGATIVE mg/dL
Leukocytes,Ua: NEGATIVE
Nitrite: NEGATIVE
Protein, ur: NEGATIVE mg/dL
Specific Gravity, Urine: 1.011 (ref 1.005–1.030)
pH: 6 (ref 5.0–8.0)

## 2023-03-15 MED ORDER — MECLIZINE HCL 25 MG PO TABS
25.0000 mg | ORAL_TABLET | Freq: Once | ORAL | Status: AC
  Administered 2023-03-15: 25 mg via ORAL
  Filled 2023-03-15: qty 1

## 2023-03-15 MED ORDER — ONDANSETRON 4 MG PO TBDP
4.0000 mg | ORAL_TABLET | Freq: Once | ORAL | Status: AC
  Administered 2023-03-15: 4 mg via ORAL
  Filled 2023-03-15: qty 1

## 2023-03-15 NOTE — ED Provider Notes (Signed)
[place] EMERGENCY DEPARTMENT AT [facility] Provider Note  HPI   Jose Farrell is a 87 y.o. male patient with a PMHx of spinal cord stimulator with leg pain, dizziness, follows with neurology, here today with concern for dizziness.  Patient states that he has been in his normal state of health, lives alone, ambulates with cane, has been taking good care of himself.  Last night he started feeling a little sick to his stomach with some mild nausea, but did not vomit and really did not have any abdominal pain at the time.  He states he slept poorly last night, he woke up, still felt mildly sick to his stomach, and then today was ambulating, started feeling faint lightheaded, and called 911 as he was concerned.  He had no other symptoms no vision loss no focal neurodeficit during this time    ROS Negative except as per HPI   Medical Decision Making   Upon presentation, the patient is afebrile, HDS. Patient has resting tremor in the left upper extremity greater than the right upper extremity, and this more pronounced on finger-nose testing.  Patient states this is old.  He strength is 5 out of 5 in flexion extension upper and lower extremities, sensations grossly intact, cerebellar testing intact, no nystagmus, pupils are equal and reactive to light, Dix-Hallpike test is negative there is no inducible nystagmus and no inducible symptoms with head tilt testing  For this patient, there is no active nystagmus and this patient has been here in the emergency department for over 6 hours, he had already received some meclizine and Zofran, he states his symptoms have been resolved for hours now if not even prior to coming in.  He endorses that he takes a medication that starts with IM for dizziness and he follows with neurology for this, this is most likely meclizine he states that he has a 3 times daily as needed and did not take it today.  He is states that he has had extensive workups in the  past, and has had this symptom for over 20 years although it has gotten worse in the last year or so over maybe the past couple of months.  He did have some nausea which was resolved but no vomiting of note the patient no chest pain shortness of breath abdominal pain back pain.  He has no active symptoms no active nystagmus, this is not acute vestibular syndrome, I am not concerned for an acute stroke at this time, did consider heavily the diagnosis of a posterior cerebellar stroke.  Also considered BPPV, and he has no inducible nystagmus on my exam, this is a chronic problem with an acute presentation, and overall I am not concerned that there is any acute process happening.  Prior to me seeing him, he already had a normal lab workup including CBC CMP urine, and ECG that shows sinus rhythm.  He is not tachycardic, and he has had no chest pain or shortness of breath.  I really do not think this patient requires any additional workup at this time based on my exam and history.  My attending evaluated this patient, will discharge this patient. Will talk to PCP regarding his flomax use, which could trigger his symptoms.  At this time, I feel that the patient is medically cleared for discharge and have discussed this with my attending who agrees.  I discussed with the patient and/or family my overall assessment, including my physical exam, labs, imaging, other diagnostic  tests, and therapeutics given.  All questions answered and understanding is expressed.  I have instructed to call PCP to establish an outpatient appointment after this ED visit, and necessary specialty follow up if needed. I gave strict return precautions to come back to the ED including fevers, chills, severe pain, worsening of symptoms, return of symptoms, new and concerning symptoms, inability to tolerate p.o. intake, among others. I specifically stated to return if symptoms worsen return   1. Peripheral vertigo, unspecified laterality      @DISPOSITION @  Rx / DC Orders ED Discharge Orders     None        Past Medical History:  Diagnosis Date   Anxiety    BPH (benign prostatic hyperplasia)    Cancer (HCC)    skin   ED (erectile dysfunction)    GERD (gastroesophageal reflux disease)    Gout    Hemorrhoids    Hyperlipidemia    Insomnia    Lower back pain    Lumbar stenosis    Positional vertigo    Post laminectomy syndrome    Right knee pain    S/P left knee arthroscopy    Sleep apnea    wears CPAP   Wears hearing aid in both ears    Past Surgical History:  Procedure Laterality Date   CATARACT EXTRACTION W/ INTRAOCULAR LENS  IMPLANT, BILATERAL     COLONOSCOPY     KNEE SURGERY     LUMBAR SPINE SURGERY  4/10, 6/10   MOHS SURGERY     SPINAL CORD STIMULATOR INSERTION N/A 05/09/2018   Procedure: LUMBAR SPINAL CORD STIMULATOR INSERTION;  Surgeon: Odette Fraction, MD;  Location[facility];  Service: Neurosurgery;  Laterality: N/A;  LUMBAR SPINAL CORD STIMULATOR INSERTION   TOTAL HIP ARTHROPLASTY     WISDOM TOOTH EXTRACTION     Family History  Problem Relation Age of Onset   Kidney disease Father    Diabetes Mother    Hip fracture Mother    Social History   Socioeconomic History   Marital status: Married    Spouse name: Not on file   Number of children: 2   Years of education: College   Highest education level: Not on file  Occupational History   Occupation: Retired  Tobacco Use   Smoking status: Never   Smokeless tobacco: Never  Vaping Use   Vaping status: Never Used  Substance and Sexual Activity   Alcohol use: Yes    Alcohol/week: 0.0 standard drinks of alcohol    Comment: occasional beer   Drug use: No   Sexual activity: Not on file  Other Topics Concern   Not on file  Social History Narrative   Not on file   Social Drivers of Health   Financial Resource Strain: Not on file  Food Insecurity: Not on file  Transportation Needs: Not on file  Physical Activity: Not on file  Stress:  Not on file  Social Connections: Not on file  Intimate Partner Violence: Not on file     Physical Exam   Vitals:   03/15/23 0858 03/15/23 1202  BP: 139/72 137/68  Pulse: 62 66  Resp: 18 18  Temp: 98.2 F (36.8 C) 98.3 F (36.8 C)  TempSrc: Oral Oral  SpO2: 95% 97%    Physical Exam Vitals and nursing note reviewed.  Constitutional:      General: He is not in acute distress.    Appearance: Normal appearance. He is well-developed. He is not ill-appearing or  toxic-appearing.  HENT:     Head: Normocephalic and atraumatic.     Right Ear: External ear normal.     Left Ear: External ear normal.     Nose: Nose normal.     Mouth/Throat:     Mouth: Mucous membranes are moist.  Eyes:     Extraocular Movements: Extraocular movements intact.     Pupils: Pupils are equal, round, and reactive to light.  Cardiovascular:     Rate and Rhythm: Normal rate.     Pulses: Normal pulses.  Pulmonary:     Effort: Pulmonary effort is normal. No respiratory distress.     Breath sounds: Normal breath sounds. No stridor. No wheezing, rhonchi or rales.  Abdominal:     Palpations: Abdomen is soft.     Tenderness: There is no abdominal tenderness. There is no right CVA tenderness or left CVA tenderness.  Musculoskeletal:        General: Normal range of motion.     Cervical back: Normal range of motion and neck supple.  Skin:    General: Skin is warm and dry.     Capillary Refill: Capillary refill takes less than 2 seconds.  Neurological:     General: No focal deficit present.     Mental Status: He is alert and oriented to person, place, and time. Mental status is at baseline.     Comments: Patient has resting tremor in the left upper extremity greater than the right upper extremity, and this more pronounced on finger-nose testing.  Patient states this is old.  He strength is 5 out of 5 in flexion extension upper and lower extremities, sensations grossly intact, cerebellar testing intact, no  nystagmus, pupils are equal and reactive to light, Dix-Hallpike test is negative there is no inducible nystagmus and no inducible symptoms with head tilt testing  Psychiatric:        Mood and Affect: Mood normal.      Procedures   If procedures were preformed on this patient, they are listed below:  Procedures  The patient was seen, evaluated, and treated in conjunction with the attending physician, who voiced agreement in the care provided.  Note generated using Dragon voice dictation software and may contain dictation errors. Please contact me for any clarification or with any questions.   Electronically signed by:  Osvaldo Shipper, M.D. (PGY-2)    Gunnar Bulla, MD 03/15/23 1657    Pricilla Loveless, MD 03/16/23 [phone]

## 2023-03-15 NOTE — ED Triage Notes (Signed)
Pt here form home with /o dizziness for 3 weeks , pt has hx of vertigo , some slight nausea but no vomiting

## 2023-03-15 NOTE — Discharge Instructions (Signed)
Take your medicines as prescribed.  Otherwise, follow-up closely with your primary care physician, give them a call early next week.  Talk to your doctor about whether or not to switch or change some your medicines, such as Flomax.  However, if you develop new or worsening or recurring symptoms, headache, vision changes, weakness or numbness in your extremities, inability to walk, or any other new/concerning symptoms then return to the ER or call 911.

## 2023-03-15 NOTE — ED Provider Triage Note (Signed)
Emergency Medicine Provider Triage Evaluation Note  Jose Farrell , a 87 y.o. male  was evaluated in triage.  Pt complains of dizziness. Reports history of vertigo and this feels similar but is worse. Associated nausea. Dizziness is better when he holds his head still and gets worse with any head movement. Did feel off balance and almost fell last night. No headache, no numbness or weakness. No previous history of stroke.  Review of Systems  Positive: Dizziness, nausea Negative: Headache, numbness, weakess  Physical Exam  BP 139/72   Pulse 62   Temp 98.2 F (36.8 C) (Oral)   Resp 18   SpO2 95%  Gen:   Awake, no distress   Resp:  Normal effort  MSK:   Moves extremities without difficulty  Other:  No nystagmus, pupils equal and reactive, normal finger to nose, normal speech, strength and sensation intact in all 4 extremities  Medical Decision Making  Medically screening exam initiated at 9:19 AM.  Appropriate orders placed.  Jose Farrell was informed that the remainder of the evaluation will be completed by another provider, this initial triage assessment does not replace that evaluation, and the importance of remaining in the ED until their evaluation is complete.  No focal deficits to suggest stroke. Will get labs started and given meclizine and zofran.   Rexford Maus, DO 03/15/23 [phone]
